# Patient Record
Sex: Female | Born: 1980 | Race: White | Hispanic: No | Smoking: Former smoker
Health system: Southern US, Community
[De-identification: ages and names within clinical notes are randomized; demographics above are authoritative.]

## PROBLEM LIST (undated history)

## (undated) ENCOUNTER — Inpatient Hospital Stay (HOSPITAL_COMMUNITY): Payer: Self-pay

## (undated) ENCOUNTER — Inpatient Hospital Stay (HOSPITAL_COMMUNITY): Payer: No Typology Code available for payment source

## (undated) DIAGNOSIS — G43909 Migraine, unspecified, not intractable, without status migrainosus: Secondary | ICD-10-CM

## (undated) DIAGNOSIS — K219 Gastro-esophageal reflux disease without esophagitis: Secondary | ICD-10-CM

## (undated) DIAGNOSIS — O009 Unspecified ectopic pregnancy without intrauterine pregnancy: Secondary | ICD-10-CM

## (undated) DIAGNOSIS — F32A Depression, unspecified: Secondary | ICD-10-CM

## (undated) DIAGNOSIS — R102 Pelvic and perineal pain: Secondary | ICD-10-CM

## (undated) DIAGNOSIS — F329 Major depressive disorder, single episode, unspecified: Secondary | ICD-10-CM

## (undated) DIAGNOSIS — I10 Essential (primary) hypertension: Secondary | ICD-10-CM

## (undated) DIAGNOSIS — G8929 Other chronic pain: Secondary | ICD-10-CM

## (undated) DIAGNOSIS — F419 Anxiety disorder, unspecified: Secondary | ICD-10-CM

## (undated) DIAGNOSIS — T7840XA Allergy, unspecified, initial encounter: Secondary | ICD-10-CM

## (undated) HISTORY — PX: WISDOM TOOTH EXTRACTION: SHX21

## (undated) HISTORY — DX: Migraine, unspecified, not intractable, without status migrainosus: G43.909

## (undated) HISTORY — DX: Allergy, unspecified, initial encounter: T78.40XA

## (undated) HISTORY — PX: MOLE REMOVAL: SHX2046

---

## 2003-12-08 ENCOUNTER — Other Ambulatory Visit: Admission: RE | Admit: 2003-12-08 | Discharge: 2003-12-08 | Payer: Self-pay | Admitting: Obstetrics and Gynecology

## 2004-01-13 ENCOUNTER — Encounter (INDEPENDENT_AMBULATORY_CARE_PROVIDER_SITE_OTHER): Payer: Self-pay | Admitting: *Deleted

## 2004-01-13 ENCOUNTER — Ambulatory Visit (HOSPITAL_COMMUNITY): Admission: RE | Admit: 2004-01-13 | Discharge: 2004-01-13 | Payer: Self-pay | Admitting: Obstetrics and Gynecology

## 2005-01-11 ENCOUNTER — Encounter: Admission: RE | Admit: 2005-01-11 | Discharge: 2005-01-11 | Payer: Self-pay | Admitting: Neurology

## 2005-03-07 ENCOUNTER — Other Ambulatory Visit: Admission: RE | Admit: 2005-03-07 | Discharge: 2005-03-07 | Payer: Self-pay | Admitting: Obstetrics and Gynecology

## 2006-03-12 ENCOUNTER — Other Ambulatory Visit: Admission: RE | Admit: 2006-03-12 | Discharge: 2006-03-12 | Payer: Self-pay | Admitting: Obstetrics and Gynecology

## 2007-03-22 ENCOUNTER — Other Ambulatory Visit: Admission: RE | Admit: 2007-03-22 | Discharge: 2007-03-22 | Payer: Self-pay | Admitting: Obstetrics and Gynecology

## 2007-09-12 ENCOUNTER — Other Ambulatory Visit: Admission: RE | Admit: 2007-09-12 | Discharge: 2007-09-12 | Payer: Self-pay | Admitting: Obstetrics and Gynecology

## 2008-03-19 ENCOUNTER — Other Ambulatory Visit: Admission: RE | Admit: 2008-03-19 | Discharge: 2008-03-19 | Payer: Self-pay | Admitting: Obstetrics and Gynecology

## 2009-04-06 ENCOUNTER — Other Ambulatory Visit: Admission: RE | Admit: 2009-04-06 | Discharge: 2009-04-06 | Payer: Self-pay | Admitting: Obstetrics and Gynecology

## 2010-07-15 NOTE — Op Note (Signed)
NAMEJONELL, BRUMBAUGH                ACCOUNT NO.:  [id number]   MEDICAL RECORD NO.:  [id number]          PATIENT TYPE:  AMB   LOCATION:  SDC                           FACILITY:  [facility]   PHYSICIAN:  Charles A. 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   PROCEDURE:  Wide local excision.   SURGEON:  Charles A. Delcambre, MD   ASSISTANT:  None.   COMPLICATIONS:  None.   ESTIMATED BLOOD LOSS:  Nil.   SPECIMENS:  Elliptical specimen with margins approximately 2-3 mm  bilaterally.   COUNTS:  Instrument and sponge count correct.  Needle count correct.   ANESTHESIA:  Laryngeal general and local block.   DESCRIPTION OF PROCEDURE:  The patient was taken to the operating room,  placed in the supine position and general anesthetic was induced without  difficulty.  Dorsal lithotomy position was then done and a sterile prep and  drape was undertaken.  A marking pen was then used to mark elliptical  specimen for excision with adequate margins and careful excision with a  knife was undertaken.  Deep closure with 3-0 Vicryl was used, interrupted  sutures and then a subcuticular suture of 3-0 Monocryl was used to close the  skin.  The patient was awakened and taken to recovery with physician in  attendance having tolerated the procedure well.      CAD/MEDQ  D:  01/13/2004  T:  01/14/2004  Job:  [id number]

## 2010-07-15 NOTE — H&P (Signed)
Karen Holder, Karen Holder                ACCOUNT NO.:  [id number]   MEDICAL RECORD NO.:  [id number]          PATIENT TYPE:  AMB   LOCATION:  SDC                           FACILITY:  [facility]   PHYSICIAN:  Charles A. Delcambre, MDDATE OF BIRTH:  September 29, 1980   DATE OF ADMISSION:  01/13/2004  DATE OF DISCHARGE:                                HISTORY & PHYSICAL   She is to be admitted January 13, 2004, for wide local excision of the  vulva for vulvar lesion.  This lesion has been undertaken for biopsy showing  atypical genital nevus with recommendation for wide complete local excision.   PAST MEDICAL HISTORY:  Migraine headaches.   PAST SURGICAL HISTORY:  None.   MEDICATIONS:  Relpax 40 mg p.r.n. migraine, Topamax 100 mg at bedtime, and  Baclofen 100 mg t.i.d., all for migraines.  Ortho Tri-Cyclen once a day for  birth control.   ALLERGIES:  FLU SHOT reaction?   SOCIAL HISTORY:  No tobacco, ethanol, or drug use.  STD exposure in the  past.  The patient is married and in a monogamous relationship with her  husband.  Stopped smoking three years ago.  Recently married in August.  The  patient is a Software engineer at a neurosurgeon's.   FAMILY HISTORY:  No siblings.  Mother and father with hypertension.  Father  69, mother 40.  Otherwise, no gynecological cancers.   REVIEW OF SYSTEMS:  No fever, chills, rashes, lesions, headaches, dizziness.  Some seasonal allergies.  No chest pain, shortness of breath, wheezing,  diarrhea, constipation, bleeding, melena, hematochezia, urgency, frequency,  dysuria, incontinence, hematuria, galactorrhea.  She does have some very  slight itching, but otherwise the lesion has been present for many years and  is asymptomatic at this time.   PHYSICAL EXAMINATION:  GENERAL:  She is alert and oriented x3, in no  distress.  Alert and oriented x3.  VITAL SIGNS:  Weight 139 pounds, pulse 76, blood pressure 110/80.  HEENT:  Grossly within normal limits.  NECK:   Supple without thyromegaly.  LUNGS:  Clear bilaterally.  BACK:  CVA nontender bilaterally.  Vertebral column nontender.  BREASTS:  No mass, tenderness, or discharge.  Skin:  No changes bilaterally.  CARDIAC:  Regular rate and rhythm without murmur, rub, or gallop.  Lungs are  clear.  ABDOMEN:  Soft, flat, nontender.  No hepatosplenomegaly or other masses  noted.  Groins nontender, no masses are palpable.  PELVIC:  Vulva:  On the right inferior labia area, there is a small lesion,  slightly raised with some pigmented spots, 1 cm in the inferior right labia  majora.  This is the area that was biopsied.  Otherwise, urethral meatus  normal.  Urethra normal.  Bladder normal.  Vagina normal.  Cervix  nulliparous.  Vault without discharge or lesions.  No cervical motion  tenderness.  Pap was normal.  Bimanual exam:  The uterus is retroflexed, six  weeks' size, mobile and nontender.  Adnexa nontender without masses  bilaterally.  Ovaries palpably normal size bilaterally.   ASSESSMENT:  A perineal lesion, atypical nevus, for  wide local excision.   PLAN:  The patient will be admitted.  A CBC will be drawn.  NPO past  midnight.  Wide local excision, likely with stitches, will be undertaken.  She gives informed consent, understands a significant scar will be  remaining, but we need to get at least 1-2 mm margins on the lesion and  would expect this incision to be at least 2 cm in length to get an  elliptical excision of the lesion.  She was in agreement and gives informed  consent, and we will proceed as outlined.      CAD/MEDQ  D:  01/06/2004  T:  01/06/2004  Job:  [id number]

## 2010-09-01 ENCOUNTER — Encounter: Payer: Self-pay | Admitting: Gastroenterology

## 2010-09-26 ENCOUNTER — Ambulatory Visit
Admission: RE | Admit: 2010-09-26 | Discharge: 2010-09-26 | Disposition: A | Discharge status: Home / Self Care | Payer: BC Managed Care – PPO | Source: Ambulatory Visit | Attending: Neurological Surgery | Admitting: Neurological Surgery

## 2010-09-26 ENCOUNTER — Other Ambulatory Visit: Payer: Self-pay | Admitting: Neurological Surgery

## 2010-09-26 DIAGNOSIS — R102 Pelvic and perineal pain: Secondary | ICD-10-CM

## 2010-09-26 MED ORDER — IOHEXOL 300 MG/ML  SOLN: 100.0000 mL | Freq: Once | INTRAMUSCULAR | Status: AC | PRN

## 2010-09-27 ENCOUNTER — Other Ambulatory Visit: Payer: Self-pay | Admitting: Neurological Surgery

## 2010-09-27 ENCOUNTER — Ambulatory Visit
Admission: RE | Admit: 2010-09-27 | Discharge: 2010-09-27 | Disposition: A | Discharge status: Home / Self Care | Payer: BC Managed Care – PPO | Source: Ambulatory Visit | Attending: Neurological Surgery | Admitting: Neurological Surgery

## 2010-09-27 DIAGNOSIS — R05 Cough: Secondary | ICD-10-CM

## 2010-10-07 ENCOUNTER — Ambulatory Visit: Payer: Self-pay | Admitting: Gastroenterology

## 2010-10-10 ENCOUNTER — Ambulatory Visit (INDEPENDENT_AMBULATORY_CARE_PROVIDER_SITE_OTHER): Payer: Self-pay | Admitting: General Surgery

## 2011-08-22 LAB — OB RESULTS CONSOLE ABO/RH: RH Type: POSITIVE

## 2011-08-22 LAB — OB RESULTS CONSOLE RUBELLA ANTIBODY, IGM: Rubella: IMMUNE

## 2011-08-22 LAB — OB RESULTS CONSOLE HIV ANTIBODY (ROUTINE TESTING): HIV: NONREACTIVE

## 2011-09-04 LAB — OB RESULTS CONSOLE GC/CHLAMYDIA: Gonorrhea: NEGATIVE

## 2012-02-28 ENCOUNTER — Encounter (HOSPITAL_COMMUNITY): Payer: Self-pay

## 2012-02-28 ENCOUNTER — Inpatient Hospital Stay (HOSPITAL_COMMUNITY)
Admission: AD | Admit: 2012-02-28 | Discharge: 2012-02-29 | Disposition: A | Discharge status: Home / Self Care | Payer: No Typology Code available for payment source | Source: Ambulatory Visit | Attending: Obstetrics & Gynecology | Admitting: Obstetrics & Gynecology

## 2012-02-28 DIAGNOSIS — O99891 Other specified diseases and conditions complicating pregnancy: Secondary | ICD-10-CM | POA: Insufficient documentation

## 2012-02-28 DIAGNOSIS — R030 Elevated blood-pressure reading, without diagnosis of hypertension: Secondary | ICD-10-CM | POA: Insufficient documentation

## 2012-02-28 DIAGNOSIS — M545 Low back pain, unspecified: Secondary | ICD-10-CM | POA: Insufficient documentation

## 2012-02-28 DIAGNOSIS — M899 Disorder of bone, unspecified: Secondary | ICD-10-CM | POA: Insufficient documentation

## 2012-02-28 DIAGNOSIS — I10 Essential (primary) hypertension: Secondary | ICD-10-CM

## 2012-02-28 DIAGNOSIS — M5430 Sciatica, unspecified side: Secondary | ICD-10-CM | POA: Insufficient documentation

## 2012-02-28 DIAGNOSIS — M259 Joint disorder, unspecified: Secondary | ICD-10-CM | POA: Insufficient documentation

## 2012-02-28 HISTORY — DX: Depression, unspecified: F32.A

## 2012-02-28 HISTORY — DX: Major depressive disorder, single episode, unspecified: F32.9

## 2012-02-28 HISTORY — DX: Essential (primary) hypertension: I10

## 2012-02-28 LAB — URINALYSIS, ROUTINE W REFLEX MICROSCOPIC
Bilirubin Urine: NEGATIVE
Glucose, UA: NEGATIVE mg/dL
Hgb urine dipstick: NEGATIVE
Nitrite: NEGATIVE
Protein, ur: NEGATIVE mg/dL
Specific Gravity, Urine: 1.01 (ref 1.005–1.030)
Urobilinogen, UA: 0.2 mg/dL (ref 0.0–1.0)
pH: 7 (ref 5.0–8.0)

## 2012-02-28 LAB — PROTEIN / CREATININE RATIO, URINE
Creatinine, Urine: 44.41 mg/dL
Protein Creatinine Ratio: 0.25 — ABNORMAL HIGH (ref 0.00–0.15)
Total Protein, Urine: 11 mg/dL

## 2012-02-28 LAB — COMPREHENSIVE METABOLIC PANEL
ALT: 18 U/L (ref 0–35)
Albumin: 2.6 g/dL — ABNORMAL LOW (ref 3.5–5.2)
Alkaline Phosphatase: 115 U/L (ref 39–117)
BUN: 6 mg/dL (ref 6–23)
CO2: 25 mEq/L (ref 19–32)
Chloride: 105 mEq/L (ref 96–112)
Creatinine, Ser: 0.78 mg/dL (ref 0.50–1.10)
GFR calc Af Amer: >90 mL/min (ref >90)
GFR calc non Af Amer: >90 mL/min (ref >90)
Glucose, Bld: 86 mg/dL (ref 70–99)
Potassium: 3.5 mEq/L (ref 3.5–5.1)
Sodium: 139 mEq/L (ref 135–145)
Total Bilirubin: 0.3 mg/dL (ref 0.3–1.2)
Total Protein: 6 g/dL (ref 6.0–8.3)

## 2012-02-28 LAB — CBC
HCT: 36.6 % (ref 36.0–46.0)
Hemoglobin: 12.5 g/dL (ref 12.0–15.0)
MCH: 29.9 pg (ref 26.0–34.0)
MCHC: 34.2 g/dL (ref 30.0–36.0)
MCV: 87.6 fL (ref 78.0–100.0)
RBC: 4.18 MIL/uL (ref 3.87–5.11)
RDW: 13.5 % (ref 11.5–15.5)
WBC: 10.6 10*3/uL — ABNORMAL HIGH (ref 4.0–10.5)

## 2012-02-28 LAB — URINE MICROSCOPIC-ADD ON

## 2012-02-28 LAB — URIC ACID: Uric Acid, Serum: 5 mg/dL (ref 2.4–7.0)

## 2012-02-28 LAB — LACTATE DEHYDROGENASE: LDH: 151 U/L (ref 94–250)

## 2012-02-28 MED ORDER — LACTATED RINGERS IV BOLUS (SEPSIS)
500.0000 mL | Freq: Once | INTRAVENOUS | Status: AC
  Administered 2012-02-28: 500 mL via INTRAVENOUS

## 2012-02-28 NOTE — MAU Note (Signed)
Contractions and back pain radiating down left leg today called on call nurse was told to come in to be checked, no LOF, no vaginal bleeding, positive fm, no problems during pregnancy.

## 2012-02-28 NOTE — MAU Note (Signed)
Patient taken directly to room 6 from lobby.

## 2012-02-28 NOTE — MAU Provider Note (Signed)
History   Patient had pesented with complaint of lower back c/w sciatia. On presentation the patient had elevated B/p and now will investigate for [facility] or onset of Pre-clampsia G1P0000, [redacted]w[redacted]d    CSN: [id number]  Arrival date and time: 02/28/12 1941   None     Chief Complaint  Patient presents with  . Contractions   HPI  OB History    Grav Para Term Preterm Abortions TAB SAB Ect Mult Living   1               Past Medical History  Diagnosis Date  . Depression     No past surgical history on file.  No family history on file.  History  Substance Use Topics  . Smoking status: Never Smoker   . Smokeless tobacco: Not on file  . Alcohol Use: No    Allergies:  Allergies  Allergen Reactions  . Eggs Or Egg-Derived Products Diarrhea    Prescriptions prior to admission  Medication Sig Dispense Refill  . acetaminophen (TYLENOL) 325 MG tablet Take 650 mg by mouth every 6 (six) hours as needed. headache      . butalbital-acetaminophen-caffeine (FIORICET, ESGIC) 50-325-40 MG per tablet Take 1 tablet by mouth 2 (two) times daily as needed. migrain      . calcium carbonate (TUMS - DOSED IN MG ELEMENTAL CALCIUM) 500 MG chewable tablet Chew 1 tablet by mouth daily. heartburn      . FLUoxetine (PROZAC) 40 MG capsule Take 40 mg by mouth daily. Depression      . Prenatal Vit-Fe Fumarate-FA (PRENATAL MULTIVITAMIN) TABS Take 1 tablet by mouth daily.      Marland Kitchen zolpidem (AMBIEN CR) 12.5 MG CR tablet Take 12.5 mg by mouth at bedtime as needed. sleep        Review of Systems  Constitutional: Negative.   HENT: Negative.   Eyes: Negative.   Respiratory: Negative.   Cardiovascular:       Elevated b/p   Gastrointestinal: Negative.   Genitourinary: Negative.   Musculoskeletal: Negative.   Skin: Negative.   Endo/Heme/Allergies: Negative.   Psychiatric/Behavioral: Negative.    Physical Exam   Blood pressure 152/98, pulse 78, temperature 98.5 F (36.9 C), temperature source Oral, resp.  rate 18, height 5\' 5"  (1.651 m), weight 102.15 kg (225 lb 3.2 oz).  Physical Exam  Constitutional: She is oriented to person, place, and time. She appears well-developed and well-nourished.  HENT:  Head: Normocephalic and atraumatic.  Right Ear: External ear normal.  Eyes: Conjunctivae normal are normal. Pupils are equal, round, and reactive to light.  Neck: Normal range of motion. Neck supple.  Cardiovascular: Normal rate, regular rhythm and normal heart sounds.   Respiratory: Effort normal and breath sounds normal.  GI: Soft. Bowel sounds are normal.  Genitourinary: Vagina normal and uterus normal.       SVE: Cx closed /90%/+1, Vx presentation with Vx engaged in pelvis.  Irregular uterine contractions at time of admission and IV hydation attempted but infiltrated. Po hydration and Uc's subsided spontaneously.   Musculoskeletal: Normal range of motion.  Neurological: She is alert and oriented to person, place, and time. She has normal reflexes.  Skin: Skin is warm and dry.  Psychiatric: She has a normal mood and affect.    MAU Course  Procedures CBC, CMP, P/C ratio  And Serial B/p's, to collect 24 hr urine Protein/Creatinine as outpatient.  Assessment and Plan  Collaboration of management with Dr Seymour Bars. Patient has had labile B/p's  and main complaint sciatica and patient has been advised to wear pregnancy belt and to use heating pad for symptom relief. Patient had been offered medications but declined. Patient will use OTC Tylenol for analgesia. Will run 24 hr baseline urine for protein/ creatinine as outpatient and to return to lab at office . Jug given to commence 24 hr urine collection. Plan to discharge to home and has scheduled Appointment with Dr. Juliene Pina 03/06/12.  Prospero Mahnke, CNM. 02/28/2012, 11:09 PM

## 2012-02-28 NOTE — L&D Delivery Note (Signed)
Delivery Note  First Stage: Labor onset: 0100 Augmentation : pitocin Analgesia /Anesthesia intrapartum: epidural ROM spontaneous at 0100  Second Stage: Complete dilation at 0725 Onset of pushing at 0730 FHR second stage 150s  Delivery of a viable female at 64 by CNM in LOA position.  Loose nuchal cord x 1 reduced over head at birth Cord double clamped after cessation of pulsation, cut by FOB.  Cord blood sample collected.   Third Stage: Placenta delivered shultz intact with 3 VC.  Placenta - 0801 Uterine tone boggy with brisk bleeding - pitocin bolus in IVF  Expressed clots  Straight cath - >3107ml out Fundus firm - LUSD remains boggy despite pitocin / massage - cytotec per rectum Bimanual compression of cervix and LUSD for hemostasis  Perineum intact Superficial vaginal laceration identified - hemostatic Sulcus intact / cervix intact just boggy and remained dilated initially - responded well to cytotec  Repair not indicated Est. Blood Loss (mL): - in collection bag (some clots included)  Complications: mild postpartum hemorrhage  Mom to postpartum.  Baby to nursery-stable but some grunting at rest  Newborn: Birth Weight: 6 lb 7 oz Apgar Scores:  7-8 Feeding planned: breast  Marlinda Mike CNM, MSN 03/03/2012, 8:26 AM

## 2012-02-28 NOTE — MAU Note (Signed)
Patient c/o IV hurting, IV discontinued. Explained to patient will not restart at this time.

## 2012-03-03 ENCOUNTER — Encounter (HOSPITAL_COMMUNITY): Payer: Self-pay | Admitting: Anesthesiology

## 2012-03-03 ENCOUNTER — Inpatient Hospital Stay (HOSPITAL_COMMUNITY)
Admission: AD | Admit: 2012-03-03 | Discharge: 2012-03-05 | DRG: 774 | Disposition: A | Discharge status: Home / Self Care | Payer: No Typology Code available for payment source | Source: Ambulatory Visit | Attending: Obstetrics and Gynecology | Admitting: Obstetrics and Gynecology

## 2012-03-03 ENCOUNTER — Encounter (HOSPITAL_COMMUNITY): Payer: Self-pay | Admitting: *Deleted

## 2012-03-03 ENCOUNTER — Inpatient Hospital Stay (HOSPITAL_COMMUNITY): Payer: No Typology Code available for payment source | Admitting: Anesthesiology

## 2012-03-03 DIAGNOSIS — D62 Acute posthemorrhagic anemia: Secondary | ICD-10-CM | POA: Diagnosis not present

## 2012-03-03 DIAGNOSIS — O139 Gestational [pregnancy-induced] hypertension without significant proteinuria, unspecified trimester: Principal | ICD-10-CM | POA: Diagnosis present

## 2012-03-03 DIAGNOSIS — O9903 Anemia complicating the puerperium: Secondary | ICD-10-CM | POA: Diagnosis not present

## 2012-03-03 DIAGNOSIS — E669 Obesity, unspecified: Secondary | ICD-10-CM | POA: Diagnosis present

## 2012-03-03 DIAGNOSIS — O3660X0 Maternal care for excessive fetal growth, unspecified trimester, not applicable or unspecified: Secondary | ICD-10-CM | POA: Diagnosis present

## 2012-03-03 LAB — CBC
HCT: 35.9 % — ABNORMAL LOW (ref 36.0–46.0)
HCT: 26.9 % — ABNORMAL LOW (ref 36.0–46.0)
Hemoglobin: 12.2 g/dL (ref 12.0–15.0)
Hemoglobin: 9.1 g/dL — ABNORMAL LOW (ref 12.0–15.0)
MCH: 29.8 pg (ref 26.0–34.0)
MCH: 30.1 pg (ref 26.0–34.0)
MCH: 30.1 pg (ref 26.0–34.0)
MCHC: 34 g/dL (ref 30.0–36.0)
MCHC: 33.8 g/dL (ref 30.0–36.0)
MCV: 87.8 fL (ref 78.0–100.0)
MCV: 89.2 fL (ref 78.0–100.0)
MCV: 89.1 fL (ref 78.0–100.0)
Platelets: 164 10*3/uL (ref 150–400)
Platelets: 158 10*3/uL (ref 150–400)
Platelets: 143 10*3/uL — ABNORMAL LOW (ref 150–400)
RBC: 4.09 MIL/uL (ref 3.87–5.11)
RBC: 3.02 MIL/uL — ABNORMAL LOW (ref 3.87–5.11)
RDW: 13.3 % (ref 11.5–15.5)
RDW: 13.6 % (ref 11.5–15.5)
RDW: 13.5 % (ref 11.5–15.5)
WBC: 10.9 10*3/uL — ABNORMAL HIGH (ref 4.0–10.5)
WBC: 22.1 10*3/uL — ABNORMAL HIGH (ref 4.0–10.5)
WBC: 14.5 10*3/uL — ABNORMAL HIGH (ref 4.0–10.5)

## 2012-03-03 LAB — URINE MICROSCOPIC-ADD ON

## 2012-03-03 LAB — POCT FERN TEST: POCT Fern Test: POSITIVE

## 2012-03-03 LAB — URINALYSIS, ROUTINE W REFLEX MICROSCOPIC
Bilirubin Urine: NEGATIVE
Glucose, UA: NEGATIVE mg/dL
Ketones, ur: NEGATIVE mg/dL
Leukocytes, UA: NEGATIVE
Nitrite: NEGATIVE
Protein, ur: NEGATIVE mg/dL
Urobilinogen, UA: 0.2 mg/dL (ref 0.0–1.0)

## 2012-03-03 LAB — COMPREHENSIVE METABOLIC PANEL
ALT: 14 U/L (ref 0–35)
AST: 15 U/L (ref 0–37)
Albumin: 2.4 g/dL — ABNORMAL LOW (ref 3.5–5.2)
Alkaline Phosphatase: 114 U/L (ref 39–117)
BUN: 9 mg/dL (ref 6–23)
CO2: 25 mEq/L (ref 19–32)
Calcium: 9.8 mg/dL (ref 8.4–10.5)
Chloride: 103 mEq/L (ref 96–112)
Creatinine, Ser: 0.72 mg/dL (ref 0.50–1.10)
GFR calc Af Amer: >90 mL/min (ref >90)
GFR calc non Af Amer: >90 mL/min (ref >90)
Glucose, Bld: 90 mg/dL (ref 70–99)
Potassium: 3.5 mEq/L (ref 3.5–5.1)
Sodium: 139 mEq/L (ref 135–145)
Total Bilirubin: 0.3 mg/dL (ref 0.3–1.2)
Total Protein: 5.7 g/dL — ABNORMAL LOW (ref 6.0–8.3)

## 2012-03-03 LAB — LACTATE DEHYDROGENASE: LDH: 149 U/L (ref 94–250)

## 2012-03-03 LAB — URIC ACID: Uric Acid, Serum: 5.4 mg/dL (ref 2.4–7.0)

## 2012-03-03 LAB — RPR: RPR Ser Ql: NONREACTIVE

## 2012-03-03 MED ORDER — LANOLIN HYDROUS EX OINT: TOPICAL_OINTMENT | CUTANEOUS | Status: DC | PRN

## 2012-03-03 MED ORDER — SIMETHICONE 80 MG PO CHEW: 80.0000 mg | CHEWABLE_TABLET | ORAL | Status: DC | PRN

## 2012-03-03 MED ORDER — FENTANYL 2.5 MCG/ML BUPIVACAINE 1/10 % EPIDURAL INFUSION (WH - ANES)
14.0000 mL/h | INTRAMUSCULAR | Status: DC
  Filled 2012-03-03: qty 125

## 2012-03-03 MED ORDER — MISOPROSTOL 200 MCG PO TABS
ORAL_TABLET | ORAL | Status: AC
  Administered 2012-03-03: 800 ug via RECTAL
  Filled 2012-03-03: qty 4

## 2012-03-03 MED ORDER — OXYTOCIN 40 UNITS IN LACTATED RINGERS INFUSION - SIMPLE MED: 62.5000 mL/h | INTRAVENOUS | Status: AC

## 2012-03-03 MED ORDER — DIBUCAINE 1 % RE OINT: 1.0000 "application " | TOPICAL_OINTMENT | RECTAL | Status: DC | PRN

## 2012-03-03 MED ORDER — LIDOCAINE HCL (PF) 1 % IJ SOLN
30.0000 mL | INTRAMUSCULAR | Status: DC | PRN
  Administered 2012-03-03: 30 mL via SUBCUTANEOUS
  Filled 2012-03-03: qty 30

## 2012-03-03 MED ORDER — LACTATED RINGERS IV SOLN: 500.0000 mL | INTRAVENOUS | Status: DC | PRN

## 2012-03-03 MED ORDER — NALBUPHINE SYRINGE 5 MG/0.5 ML
5.0000 mg | INJECTION | INTRAMUSCULAR | Status: DC | PRN
  Administered 2012-03-03: 5 mg via INTRAVENOUS
  Filled 2012-03-03: qty 0.5

## 2012-03-03 MED ORDER — BENZOCAINE-MENTHOL 20-0.5 % EX AERO
1.0000 "application " | INHALATION_SPRAY | CUTANEOUS | Status: DC | PRN
  Administered 2012-03-03: 1 via TOPICAL
  Filled 2012-03-03: qty 56

## 2012-03-03 MED ORDER — LACTATED RINGERS IV SOLN
INTRAVENOUS | Status: DC
  Administered 2012-03-03 (×2): via INTRAVENOUS

## 2012-03-03 MED ORDER — OXYTOCIN BOLUS FROM INFUSION: 500.0000 mL | INTRAVENOUS | Status: DC

## 2012-03-03 MED ORDER — CITRIC ACID-SODIUM CITRATE 334-500 MG/5ML PO SOLN: 30.0000 mL | ORAL | Status: DC | PRN

## 2012-03-03 MED ORDER — PHENYLEPHRINE 40 MCG/ML (10ML) SYRINGE FOR IV PUSH (FOR BLOOD PRESSURE SUPPORT): 80.0000 ug | PREFILLED_SYRINGE | INTRAVENOUS | Status: DC | PRN

## 2012-03-03 MED ORDER — EPHEDRINE 5 MG/ML INJ: 10.0000 mg | INTRAVENOUS | Status: DC | PRN

## 2012-03-03 MED ORDER — ONDANSETRON HCL 4 MG/2ML IJ SOLN: 4.0000 mg | INTRAMUSCULAR | Status: DC | PRN

## 2012-03-03 MED ORDER — IBUPROFEN 600 MG PO TABS
600.0000 mg | ORAL_TABLET | Freq: Four times a day (QID) | ORAL | Status: DC
  Administered 2012-03-03 – 2012-03-05 (×8): 600 mg via ORAL
  Filled 2012-03-03 (×8): qty 1

## 2012-03-03 MED ORDER — PRENATAL MULTIVITAMIN CH
1.0000 | ORAL_TABLET | Freq: Every day | ORAL | Status: DC
  Administered 2012-03-03 – 2012-03-05 (×3): 1 via ORAL
  Filled 2012-03-03 (×3): qty 1

## 2012-03-03 MED ORDER — FLUOXETINE HCL 20 MG PO CAPS
40.0000 mg | ORAL_CAPSULE | Freq: Every day | ORAL | Status: DC
  Administered 2012-03-03 – 2012-03-05 (×3): 40 mg via ORAL
  Filled 2012-03-03 (×3): qty 2

## 2012-03-03 MED ORDER — OXYTOCIN 40 UNITS IN LACTATED RINGERS INFUSION - SIMPLE MED: 62.5000 mL/h | INTRAVENOUS | Status: DC

## 2012-03-03 MED ORDER — FLUOXETINE HCL 40 MG PO CAPS
40.0000 mg | ORAL_CAPSULE | Freq: Every day | ORAL | Status: DC
Start: 2012-03-03 — End: 2012-03-03

## 2012-03-03 MED ORDER — ONDANSETRON HCL 4 MG/2ML IJ SOLN: 4.0000 mg | Freq: Four times a day (QID) | INTRAMUSCULAR | Status: DC | PRN

## 2012-03-03 MED ORDER — ACETAMINOPHEN 325 MG PO TABS: 650.0000 mg | ORAL_TABLET | ORAL | Status: DC | PRN

## 2012-03-03 MED ORDER — FENTANYL 2.5 MCG/ML BUPIVACAINE 1/10 % EPIDURAL INFUSION (WH - ANES)
INTRAMUSCULAR | Status: DC | PRN
  Administered 2012-03-03: 14 mL/h via EPIDURAL

## 2012-03-03 MED ORDER — PHENYLEPHRINE 40 MCG/ML (10ML) SYRINGE FOR IV PUSH (FOR BLOOD PRESSURE SUPPORT)
80.0000 ug | PREFILLED_SYRINGE | INTRAVENOUS | Status: DC | PRN
  Filled 2012-03-03: qty 5

## 2012-03-03 MED ORDER — OXYCODONE-ACETAMINOPHEN 5-325 MG PO TABS: 1.0000 | ORAL_TABLET | ORAL | Status: DC | PRN

## 2012-03-03 MED ORDER — WITCH HAZEL-GLYCERIN EX PADS: 1.0000 "application " | MEDICATED_PAD | CUTANEOUS | Status: DC | PRN

## 2012-03-03 MED ORDER — EPHEDRINE 5 MG/ML INJ
10.0000 mg | INTRAVENOUS | Status: DC | PRN
  Filled 2012-03-03: qty 4

## 2012-03-03 MED ORDER — DIPHENHYDRAMINE HCL 50 MG/ML IJ SOLN: 12.5000 mg | INTRAMUSCULAR | Status: DC | PRN

## 2012-03-03 MED ORDER — LIDOCAINE HCL (PF) 1 % IJ SOLN
INTRAMUSCULAR | Status: DC | PRN
  Administered 2012-03-03: 9 mL
  Administered 2012-03-03: 8 mL

## 2012-03-03 MED ORDER — SENNOSIDES-DOCUSATE SODIUM 8.6-50 MG PO TABS
2.0000 | ORAL_TABLET | Freq: Every day | ORAL | Status: DC
  Administered 2012-03-03 – 2012-03-04 (×2): 2 via ORAL

## 2012-03-03 MED ORDER — ACETAMINOPHEN 325 MG PO TABS: 650.0000 mg | ORAL_TABLET | Freq: Four times a day (QID) | ORAL | Status: DC | PRN

## 2012-03-03 MED ORDER — ONDANSETRON HCL 4 MG PO TABS: 4.0000 mg | ORAL_TABLET | ORAL | Status: DC | PRN

## 2012-03-03 MED ORDER — DIPHENHYDRAMINE HCL 25 MG PO CAPS: 25.0000 mg | ORAL_CAPSULE | Freq: Four times a day (QID) | ORAL | Status: DC | PRN

## 2012-03-03 MED ORDER — LACTATED RINGERS IV SOLN: 500.0000 mL | Freq: Once | INTRAVENOUS | Status: DC

## 2012-03-03 MED ORDER — IBUPROFEN 600 MG PO TABS: 600.0000 mg | ORAL_TABLET | Freq: Four times a day (QID) | ORAL | Status: DC | PRN

## 2012-03-03 MED ORDER — NIFEDIPINE ER 30 MG PO TB24
30.0000 mg | ORAL_TABLET | Freq: Every day | ORAL | Status: DC
  Administered 2012-03-03 – 2012-03-05 (×3): 30 mg via ORAL
  Filled 2012-03-03 (×3): qty 1

## 2012-03-03 MED ORDER — MISOPROSTOL 200 MCG PO TABS: 800.0000 ug | ORAL_TABLET | Freq: Once | ORAL | Status: AC | PRN

## 2012-03-03 MED ORDER — OXYTOCIN 40 UNITS IN LACTATED RINGERS INFUSION - SIMPLE MED
1.0000 m[IU]/min | INTRAVENOUS | Status: DC
  Administered 2012-03-03: 2 m[IU]/min via INTRAVENOUS
  Administered 2012-03-03: 4 m[IU]/min via INTRAVENOUS
  Filled 2012-03-03: qty 1000

## 2012-03-03 NOTE — MAU Note (Signed)
Got up to go to BR about 0100 and leaked a lot of fld. THen went to BR and voided so don't think it was my urine. Vomited x 1. Have leaked few more drops of fld since then. Having some contractions

## 2012-03-03 NOTE — H&P (Signed)
  OB ADMISSION/ HISTORY & PHYSICAL:  Admission Date: 03/03/2012  1:15 AM  Admit Diagnosis: SROM at 36 weeks / latent labor onset / gestational hypertension  Karen Holder is a 32 y.o. female presenting for SROM at 0100 and onset of latent labor  Prenatal History: G1P0   EDC : 03/28/2012, by Other Basis  Prenatal care at [facility]  Primary Ob Provider: Mody Prenatal course complicated by obesity / size greater than dates / depression / gestational hypertension  Prenatal Labs: ABO, Rh:   O positive Antibody:  negative Rubella:   immune RPR:   NR HBsAg:   negative HIV:   NR GBS:   negative 1 hr Glucola : NL  Labs reviewed:   baseline platelet 315 / (11/4) 189 / (1/1) 160 / (1/3) 153  (1/1) sgot 16 / sgpt 18 / creatnine 0.7 / uric acid 5.0   Negative proteinuria on dip    24 hour urine results from 1/3 not available at this time  Medical / Surgical History :  Past medical history:  Past Medical History  Diagnosis Date  . Depression   . Cancer     Past surgical history:  Past Surgical History  Procedure Date  . Mole removal    Family History:  Family History  Problem Relation Age of Onset  . Other Neg Hx     Social History:  reports that she has quit smoking. She does not have any smokeless tobacco history on file. She reports that she does not drink alcohol or use illicit drugs.  Allergies: Eggs or egg-derived products   Current Medications at time of admission:  Prenatal daily prozac 40 mg daily ambien CR 12.5 HS prn sleep Fioricet 1-2 tabs prn migraine  Review of Systems: SROM 0100 clear fluid regular ctx since 0100 no headache, vision changes or epigastric pain  Physical Exam:  VS: Blood pressure 148/110, pulse 100, temperature 98.2 F (36.8 C), temperature source Oral, resp. rate 20, height 5\' 5"  (1.651 m), weight 102.422 kg (225 lb 12.8 oz).  General: alert and oriented, appears comfortable / NAD or pain Heart: RRR Lungs: Clear  lung fields Abdomen: Gravid, soft and non-tender, non-distended / uterus: gravid (palpated EFW 8 pounds) Extremities: 2+ dependent edema  Genitalia / VE: Dilation: 2.5 Effacement (%): 90 Station: 0 Exam by:: Wiliam Ke CNM  FHR: baseline rate 140 / variability moderate / accels + / decels none TOCO: mild ctx every 5-7 minutes  Assessment: 36 weeks SROM / LGA Negative GBS Latent labor Gestational hypertension - assess for PEC development   Plan:  Admit PIH labs Active management  Dr Billy Coast notified of admission / plan of care  Marlinda Mike CNM, MSN 03/03/2012, 2:01 AM

## 2012-03-03 NOTE — Anesthesia Procedure Notes (Signed)
Epidural Patient location during procedure: OB Start time: 03/03/2012 6:07 AM End time: 03/03/2012 6:12 AM  Staffing Anesthesiologist: Sandrea Hughs Performed by: anesthesiologist   Preanesthetic Checklist Completed: patient identified, site marked, surgical consent, pre-op evaluation, timeout performed, IV checked, risks and benefits discussed and monitors and equipment checked  Epidural Patient position: sitting Prep: site prepped and draped and DuraPrep Patient monitoring: continuous pulse ox and blood pressure Approach: midline Injection technique: LOR air  Needle:  Needle type: Tuohy  Needle gauge: 17 G Needle length: 9 cm and 9 Needle insertion depth: 7 cm Catheter type: closed end flexible Catheter size: 19 Gauge Catheter at skin depth: 12 cm Test dose: negative and Other  Assessment Sensory level: T9 Events: blood not aspirated, injection not painful, no injection resistance, negative IV test and no paresthesia  Additional Notes Reason for block:procedure for pain

## 2012-03-03 NOTE — Progress Notes (Signed)
S:  Requesting another dose of IV med  O:  VS: Blood pressure 153/99, pulse 102, temperature 98.7 F (37.1 C), temperature source Oral, resp. rate 18, height 5\' 5"  (1.651 m), weight 102.422 kg (225 lb 12.8 oz).        FHR : baseline 140 / variability moderate / accels + / decels none        Toco: contractions every 3-4 minutes / mild-moderate         Cervix : 4 / 80/ vtx 0        Membranes: clear fluid        IUPC placed  A: latent labor     Gestational hypertension     FHR category 1  P: augment with pitocin - gestational hypertension     Maintain lateral positioning     Repeat IV for now - recommend epidural   Marlinda Mike CNM, MSN 03/03/2012, 5:38 AM

## 2012-03-03 NOTE — Anesthesia Postprocedure Evaluation (Signed)
Anesthesia Post Note  Patient: Karen Holder  Procedure(s) Performed: * No procedures listed *  Anesthesia type: Epidural  Patient location: Mother/Baby  Post pain: Pain level controlled  Post assessment: Post-op Vital signs reviewed  Last Vitals:  Filed Vitals:   03/03/12 1500  BP: 129/86  Pulse: 91  Temp: 37.4 C  Resp: 18    Post vital signs: Reviewed  Level of consciousness: awake  Complications: No apparent anesthesia complications

## 2012-03-03 NOTE — Progress Notes (Signed)
S:  Resting since IV analgesia  O:  VS: Blood pressure 153/99, pulse 102, temperature 98.7 F (37.1 C), temperature source Oral, resp. rate 18, height 5\' 5"  (1.651 m), weight 102.422 kg (225 lb 12.8 oz).        FHR : baseline 135 / variability moderate / accels + / decels none        Toco: contractions every 3-5 minutes / mild         Labs reviewed: 0.78 / uric acid 5.4 / sgot 14 / sgpt 16 / plt 164  A: Latent labor     FHR category 1     Gestational hypertension - no evidence of preeclampsia  P: recheck cervix when awakes from therapeutic rest dose      If no progression - place IUPC and start pitocin augment   Marlinda Mike CNM, MSN 03/03/2012, 5:20 AM

## 2012-03-03 NOTE — MAU Provider Note (Signed)
  History     CSN: [id number]  Arrival date and time: 03/03/12 0115 Provider on unit to see patient @ 0130   Chief Complaint  Patient presents with  . Rupture of Membranes   HPI SROM - large gush of clear fluid at 0100 / GBS negative Onset of regular ctx since water broke + bloody show  No headache or blurred vision  Past Medical History  Diagnosis Date  . Depression   . Cancer    Past Surgical History  Procedure Date  . Mole removal    Family History  Problem Relation Age of Onset  . Other Neg Hx    History  Substance Use Topics  . Smoking status: Former Games developer  . Smokeless tobacco: Not on file  . Alcohol Use: No   Allergies:  Allergies  Allergen Reactions  . Eggs Or Egg-Derived Products Diarrhea    Prescriptions prior to admission  Medication Sig Dispense Refill  . acetaminophen (TYLENOL) 325 MG tablet Take 650 mg by mouth every 6 (six) hours as needed. headache      . butalbital-acetaminophen-caffeine (FIORICET, ESGIC) 50-325-40 MG per tablet Take 1 tablet by mouth 2 (two) times daily as needed. migrain      . calcium carbonate (TUMS - DOSED IN MG ELEMENTAL CALCIUM) 500 MG chewable tablet Chew 1 tablet by mouth daily. heartburn      . FLUoxetine (PROZAC) 40 MG capsule Take 40 mg by mouth daily. Depression      . Prenatal Vit-Fe Fumarate-FA (PRENATAL MULTIVITAMIN) TABS Take 1 tablet by mouth daily.      Marland Kitchen zolpidem (AMBIEN CR) 12.5 MG CR tablet Take 12.5 mg by mouth at bedtime as needed. sleep        ROS Physical Exam   Blood pressure 148/110, pulse 100, temperature 98.2 F (36.8 C), temperature source Oral, resp. rate 20, height 5\' 5"  (1.651 m), weight 102.422 kg (225 lb 12.8 oz).  Physical Exam Alert and oriented Abdomen soft and non-tender / gravid  Clear fluid leakage from introitus Sterile spec exam with +pooling / nitrazine test + VE 2-3 cm / 90% / vtx / 0 station / ROP / small show Extremities 2+ edema / DTR 2+  MAU Course   Procedures  Assessment and Plan  36 weeks with SROM Latent labor Negative GBS Elevated BP - consistent with gestational hypertension / possible PEC  1) admit 2) PIH labs 3) active management  Dr Billy Coast notified of admit / plan  Marlinda Mike 03/03/2012, 1:49 AM

## 2012-03-03 NOTE — Anesthesia Preprocedure Evaluation (Signed)
Anesthesia Evaluation  Patient identified by MRN, date of birth, ID band Patient awake    Reviewed: Allergy & Precautions, H&P , NPO status , Patient's Chart, lab work & pertinent test results  Airway Mallampati: II TM Distance: >3 FB Neck ROM: full    Dental No notable dental hx.    Pulmonary neg pulmonary ROS,    Pulmonary exam normal       Cardiovascular     Neuro/Psych PSYCHIATRIC DISORDERS Depression negative neurological ROS     GI/Hepatic negative GI ROS, Neg liver ROS,   Endo/Other  negative endocrine ROS  Renal/GU negative Renal ROS  negative genitourinary   Musculoskeletal negative musculoskeletal ROS (+)   Abdominal (+) + obese,   Peds negative pediatric ROS (+)  Hematology negative hematology ROS (+)   Anesthesia Other Findings   Reproductive/Obstetrics (+) Pregnancy                           Anesthesia Physical Anesthesia Plan  ASA: II  Anesthesia Plan: Epidural   Post-op Pain Management:    Induction:   Airway Management Planned:   Additional Equipment:   Intra-op Plan:   Post-operative Plan:   Informed Consent: I have reviewed the patients History and Physical, chart, labs and discussed the procedure including the risks, benefits and alternatives for the proposed anesthesia with the patient or authorized representative who has indicated his/her understanding and acceptance.     Plan Discussed with:   Anesthesia Plan Comments:         Anesthesia Quick Evaluation

## 2012-03-03 NOTE — Progress Notes (Signed)
Report called to Dana RN in BS.  

## 2012-03-04 LAB — CBC
HCT: 26.4 % — ABNORMAL LOW (ref 36.0–46.0)
Hemoglobin: 8.7 g/dL — ABNORMAL LOW (ref 12.0–15.0)
MCH: 29.6 pg (ref 26.0–34.0)
MCHC: 33 g/dL (ref 30.0–36.0)
MCV: 89.8 fL (ref 78.0–100.0)
Platelets: 141 10*3/uL — ABNORMAL LOW (ref 150–400)
RBC: 2.94 MIL/uL — ABNORMAL LOW (ref 3.87–5.11)
RDW: 13.6 % (ref 11.5–15.5)
WBC: 12.8 10*3/uL — ABNORMAL HIGH (ref 4.0–10.5)

## 2012-03-04 LAB — COMPREHENSIVE METABOLIC PANEL
ALT: 16 U/L (ref 0–35)
AST: 26 U/L (ref 0–37)
Albumin: 2.1 g/dL — ABNORMAL LOW (ref 3.5–5.2)
Alkaline Phosphatase: 91 U/L (ref 39–117)
BUN: 6 mg/dL (ref 6–23)
CO2: 25 mEq/L (ref 19–32)
Calcium: 8.6 mg/dL (ref 8.4–10.5)
Chloride: 105 mEq/L (ref 96–112)
Creatinine, Ser: 0.74 mg/dL (ref 0.50–1.10)
GFR calc Af Amer: >90 mL/min (ref >90)
GFR calc non Af Amer: >90 mL/min (ref >90)
Glucose, Bld: 69 mg/dL — ABNORMAL LOW (ref 70–99)
Potassium: 3.8 mEq/L (ref 3.5–5.1)
Sodium: 139 mEq/L (ref 135–145)
Total Bilirubin: 0.3 mg/dL (ref 0.3–1.2)
Total Protein: 4.9 g/dL — ABNORMAL LOW (ref 6.0–8.3)

## 2012-03-04 LAB — LACTATE DEHYDROGENASE: LDH: 233 U/L (ref 94–250)

## 2012-03-04 LAB — URIC ACID: Uric Acid, Serum: 5.4 mg/dL (ref 2.4–7.0)

## 2012-03-04 NOTE — Progress Notes (Signed)

## 2012-03-04 NOTE — Progress Notes (Signed)
Patient B/P elevated 148/100. Patient became upset and started to cry after infant began grunting and this staff person wanted to spot check infant O2 sat with nursery O2 monitor. Patient denies any headache, right upper quadrant pain, or nausea or vomiting. Recheck done at 2330 B/P 133/86.

## 2012-03-04 NOTE — Progress Notes (Signed)
Patient ID: Karen Holder, female   DOB: 12/02/1980, 32 y.o.   MRN: [id number] PPD # 1  Subjective: Pt reports feeling well/ Pain controlled with ibuprofen Denies HA, SOB, dizziness, CP, epigastric pain Tolerating po/ Voiding without problems/ No n/v Bleeding is light Newborn info:  Information for the patient's newborn:  Jamella, Grayer [[id number]]  female  / circ today, per Dr Mody/ Feeding: breast   Objective:  VS: Blood pressure 126/85, pulse 80, temperature 98.3 F (36.8 C), temperature source Oral, resp. rate 20, height 5\' 5"  (1.651 m)  WT: 102.422 kg (225 lb 12.8 oz), on admission 03/03/12 @ 122am   Basename 03/04/12 0515 03/03/12 1900  WBC 12.8* 14.5*  HGB 8.7* 9.1*  HCT 26.4* 26.9*  PLT 141* 143*    Blood type: --/--/O POS, O POS (01/05 0215) Rubella: Immune (06/25 0000)    Physical Exam:  General: A & O x 3  alert, cooperative and no distress CV: Regular rate and rhythm Resp: clear Abdomen: soft, nontender, normal bowel sounds Uterine Fundus: firm, below umbilicus, nontender Perineum: healing with good reapproximation Lochia: minimal Ext: edema +1 - +2 and Homans sign is negative, no sign of DVT   A/P: PPD # 1/ G1P0101/ S/P:spontaneous vaginal delivery  Hx Gestational HTN; controlled ABL Anemia; fe supplement at d/c. Will obtain weight Doing well Continue routine post partum orders Anticipate D/C home in AM    Demetrius Revel, MSN, [facility] 03/04/2012, 10:37 AM

## 2012-03-05 DIAGNOSIS — D62 Acute posthemorrhagic anemia: Secondary | ICD-10-CM | POA: Diagnosis not present

## 2012-03-05 LAB — TYPE AND SCREEN
Antibody Screen: NEGATIVE
Unit division: 0

## 2012-03-05 MED ORDER — POLYSACCHARIDE IRON COMPLEX 150 MG PO CAPS
150.0000 mg | ORAL_CAPSULE | Freq: Every day | ORAL | Status: DC
  Administered 2012-03-05: 150 mg via ORAL
  Filled 2012-03-05: qty 1

## 2012-03-05 MED ORDER — HYDROCHLOROTHIAZIDE 12.5 MG PO CAPS
12.5000 mg | ORAL_CAPSULE | Freq: Every day | ORAL | Status: DC
  Administered 2012-03-05: 12.5 mg via ORAL
  Filled 2012-03-05: qty 1

## 2012-03-05 MED ORDER — NIFEDIPINE ER 30 MG PO TB24: 30.0000 mg | ORAL_TABLET | Freq: Every day | ORAL | Status: DC

## 2012-03-05 MED ORDER — IBUPROFEN 600 MG PO TABS: 600.0000 mg | ORAL_TABLET | Freq: Four times a day (QID) | ORAL | Status: DC

## 2012-03-05 MED ORDER — POLYSACCHARIDE IRON COMPLEX 150 MG PO CAPS: 150.0000 mg | ORAL_CAPSULE | Freq: Every day | ORAL | Status: AC

## 2012-03-05 MED ORDER — HYDROCHLOROTHIAZIDE 12.5 MG PO CAPS: 12.5000 mg | ORAL_CAPSULE | Freq: Every day | ORAL | Status: DC

## 2012-03-05 NOTE — Progress Notes (Signed)
PPD 2 SVD  S:  Reports feeling well- ready to go home             Tolerating po/ No nausea or vomiting             Bleeding is light             Pain controlled with motrin             Up ad lib / ambulatory / voiding large volume of urine per patient             No headache - vision changes - or epigastric pain  Newborn breast feeding - using S&S     O:               VS: BP 133/80  Pulse 85  Temp 98.5 F (36.9 C) (Oral)  Resp 18  Ht 5\' 5"  (1.651 m)  Wt 95.8 kg (211 lb 3.2 oz)  BMI 35.15 kg/m2  Breastfeeding? Unknown   LABS:  Basename 03/04/12 0515 03/03/12 1900  WBC 12.8* 14.5*  HGB 8.7* 9.1*  PLT 141* 143*                                    I&O:   NOT done since transfer to postpartum                            Physical Exam:             Alert and oriented X3  Lungs: Clear and unlabored  Heart: regular rate and rhythm / no mumurs  Abdomen: soft, non-tender, non-distended              Fundus: firm, non-tender, U-2  Perineum: intact - no edema  Lochia: light  Extremities: 2+ edema - decreased since admission , no calf pain or tenderness    A: PPD # 2             Gestational hypertension - stable on Procardia             ABL anemia   Doing well - stable status  P:  Routine post partum orders  Discharge to home             WOB booklet - instructions reviewed  Marlinda Mike CNM, MSN 03/05/2012, 9:35 AM

## 2012-03-05 NOTE — Discharge Summary (Signed)
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  Discharge Diagnoses: Late pre- Term Pregnancy-delivered / gestational hypertension / dependent edema / ABL anemia from third stage atony in LUS  Discharge Information: Date: 03/05/2012 Activity: pelvic rest Diet: low salt diet / increased water consumption Medications: PNV, Ibuprofen, Colace, Iron and procardia 30 XL and HCTZ 12.5 Condition: stable Instructions: refer to practice specific booklet Discharge to: home Follow-up Information    Follow up with Marlinda Mike, CNM. Schedule an appointment as soon as possible for a visit in 1 week. (blood pressure check)    Contact information:   [address] [place] [address] [phone]          Newborn Data: Live born female  Birth Weight: 6 lb 7 oz (2920 g) APGAR: 7, 8  Home with mother.  Marlinda Mike 03/05/2012, 10:07 AM

## 2012-04-13 ENCOUNTER — Other Ambulatory Visit: Payer: Self-pay

## 2013-01-02 ENCOUNTER — Other Ambulatory Visit: Payer: Self-pay

## 2013-01-28 ENCOUNTER — Ambulatory Visit (INDEPENDENT_AMBULATORY_CARE_PROVIDER_SITE_OTHER): Payer: PRIVATE HEALTH INSURANCE | Admitting: Family Medicine

## 2013-01-28 VITALS — BP 132/92 | HR 100 | Temp 99.4°F | Resp 18 | Ht 65.0 in | Wt 195.0 lb

## 2013-01-28 DIAGNOSIS — R079 Chest pain, unspecified: Secondary | ICD-10-CM

## 2013-01-28 DIAGNOSIS — Z91030 Bee allergy status: Secondary | ICD-10-CM

## 2013-01-28 DIAGNOSIS — G43909 Migraine, unspecified, not intractable, without status migrainosus: Secondary | ICD-10-CM

## 2013-01-28 DIAGNOSIS — J309 Allergic rhinitis, unspecified: Secondary | ICD-10-CM

## 2013-01-28 DIAGNOSIS — IMO0001 Reserved for inherently not codable concepts without codable children: Secondary | ICD-10-CM

## 2013-01-28 DIAGNOSIS — I10 Essential (primary) hypertension: Secondary | ICD-10-CM

## 2013-01-28 DIAGNOSIS — Z91038 Other insect allergy status: Secondary | ICD-10-CM

## 2013-01-28 LAB — POCT URINALYSIS DIPSTICK
Bilirubin, UA: NEGATIVE
Blood, UA: NEGATIVE
Glucose, UA: NEGATIVE
Spec Grav, UA: 1.015
pH, UA: 8.5

## 2013-01-28 LAB — POCT CBC
HCT, POC: 45 % (ref 37.7–47.9)
Lymph, poc: 2.9 (ref 0.6–3.4)
MCHC: 31.6 g/dL — AB (ref 31.8–35.4)
MCV: 94.7 fL (ref 80–97)
MID (cbc): 0.4 (ref 0–0.9)
POC Granulocyte: 2.9 (ref 2–6.9)
POC LYMPH PERCENT: 47 %L (ref 10–50)
Platelet Count, POC: 284 10*3/uL (ref 142–424)
RDW, POC: 12.6 %

## 2013-01-28 LAB — COMPREHENSIVE METABOLIC PANEL
Albumin: 4.5 g/dL (ref 3.5–5.2)
BUN: 12 mg/dL (ref 6–23)
CO2: 28 mEq/L (ref 19–32)
Calcium: 10 mg/dL (ref 8.4–10.5)
Chloride: 104 mEq/L (ref 96–112)
Creat: 0.99 mg/dL (ref 0.50–1.10)
Glucose, Bld: 83 mg/dL (ref 70–99)
Potassium: 4.4 mEq/L (ref 3.5–5.3)

## 2013-01-28 LAB — TSH: TSH: 0.923 u[IU]/mL (ref 0.350–4.500)

## 2013-01-28 MED ORDER — EPINEPHRINE 0.3 MG/0.3ML IJ SOAJ: 0.3000 mg | Freq: Once | INTRAMUSCULAR | Status: AC

## 2013-01-28 MED ORDER — FLUTICASONE PROPIONATE 50 MCG/ACT NA SUSP: 2.0000 | Freq: Every day | NASAL | Status: DC

## 2013-01-28 MED ORDER — METOPROLOL SUCCINATE ER 25 MG PO TB24: 25.0000 mg | ORAL_TABLET | Freq: Every day | ORAL | Status: DC

## 2013-01-28 MED ORDER — SUMATRIPTAN 20 MG/ACT NA SOLN: 20.0000 mg | NASAL | Status: DC | PRN

## 2013-01-28 NOTE — Progress Notes (Signed)
Subjective:  This chart was scribed for Ethelda Chick, MD by Leone Payor, ED Scribe. This patient was seen in room 4 and the patient's care was started 10:38 AM.    Patient ID: Karen Holder, female    DOB: Jul 08, 1980, 32 y.o.   MRN: [id number]  HPI   HPI Comments: Karen Holder is a 32 y.o. female who presents to [facility] complaining of several complaints.   Pt has a history of migraines and has been having them more frequently for the past 6 months. Pt states her triggers are sleep deprivation and lack of proper diet, both of which have been a problem since the birth of her child (11 months ago). Pt states she began having migraines at the age of 30. Pt states she has been having a migraine at least once per week. She states they are usually right sided but occasionally can be on the left side. Her last episode was 2 days ago. She applies an ice pack with mild relief. She also has an aura, photophobia, nausea, and occasional vomiting with these episodes. She takes Imitrex nasal spray and tylenol with moderate relief. She denies seeing spots. She would like a refill for Imitrex because she was taken off during her pregnancy.   Pt is also concerned for elevated BP. Pt states she had had BP problems in the last 2 weeks of her pregnancy. She has been checking it and states it is running around 143/97.   Pt reports having frequent earaches with fluid buildup. Pt states she has been feeling congested and has been taking a decongestant. Pt has allergies and occasionally takes benadryl with relief.   Pt is allergic to bees and would like a refill for her epi pen.   Pt does not know her birth mother. Her father is 21 with HTN and high cholesterol. She states he used to take medications for this but he has been able control it with diet and exercise recently. She does not have any siblings. Pt denies smoking and alcohol use. She has been married for about 10 months with first child that  is 37 months old. She is not exercising currently.   Past Medical History  Diagnosis Date  . Depression   . Cancer   . Allergy   . Migraines     onset age 60; previous [facility] consultation.  Previous trial fo Topamax, Amitriptyline.    Allergies  Allergen Reactions  . Other Swelling    Bee stings   . Eggs Or Egg-Derived Products Diarrhea   . Prior to Admission medications   Medication Sig Start Date End Date Taking? Authorizing Provider  acetaminophen (TYLENOL) 325 MG tablet Take 650 mg by mouth every 6 (six) hours as needed. headache   Yes Historical Provider, MD  ALPRAZolam Prudy Feeler) 1 MG tablet Take 1 mg by mouth 4 (four) times daily as needed for anxiety.   Yes Historical Provider, MD  buPROPion (WELLBUTRIN XL) 150 MG 24 hr tablet Take 150 mg by mouth daily.   Yes Historical Provider, MD  butalbital-acetaminophen-caffeine (FIORICET, ESGIC) 50-325-40 MG per tablet Take 1 tablet by mouth 2 (two) times daily as needed. migrain   Yes Historical Provider, MD  calcium carbonate (TUMS - DOSED IN MG ELEMENTAL CALCIUM) 500 MG chewable tablet Chew 1 tablet by mouth daily. heartburn   Yes Historical Provider, MD  diphenhydrAMINE (BENADRYL) 25 mg capsule Take 25 mg by mouth every 6 (six) hours as needed.   Yes  Historical Provider, MD  FLUoxetine (PROZAC) 40 MG capsule Take 40 mg by mouth daily. Depression   Yes Historical Provider, MD  ibuprofen (ADVIL,MOTRIN) 600 MG tablet Take 1 tablet (600 mg total) by mouth every 6 (six) hours. 03/05/12  Yes Marlinda Mike, CNM  iron polysaccharides (NIFEREX) 150 MG capsule Take 1 capsule (150 mg total) by mouth daily. 03/05/12  Yes Marlinda Mike, CNM  magnesium gluconate (MAGONATE) 500 MG tablet Take 1,500 mg by mouth once.   Yes Historical Provider, MD  Omega-3 Fatty Acids (FISH OIL) 1000 MG CAPS Take by mouth.   Yes Historical Provider, MD  Prenatal Vit-Fe Fumarate-FA (PRENATAL MULTIVITAMIN) TABS Take 1 tablet by mouth daily.   Yes Historical Provider, MD    hydrochlorothiazide (MICROZIDE) 12.5 MG capsule Take 1 capsule (12.5 mg total) by mouth daily. 03/05/12   Marlinda Mike, CNM  zolpidem (AMBIEN CR) 12.5 MG CR tablet Take 12.5 mg by mouth at bedtime as needed. sleep    Historical Provider, MD   History   Social History  . Marital Status: Married    Spouse Name: N/A    Number of Children: 1  . Years of Education: N/A   Occupational History  .      Diplomatic Services operational officer at [employer]   Social History Main Topics  . Smoking status: Former Games developer  . Smokeless tobacco: Not on file  . Alcohol Use: No  . Drug Use: No  . Sexual Activity: Yes   Other Topics Concern  . 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      Tobacco: none      Alcohol: none      Drugs:  none   Family History  Problem Relation Age of Onset  . Other Neg Hx   . Hypertension Father   . Hyperlipidemia Father    Review of Systems  HENT: Positive for congestion, ear pain and sinus pressure. Negative for sore throat.   Eyes: Positive for photophobia. Negative for visual disturbance.  Respiratory: Negative for cough and shortness of breath.   Cardiovascular: Negative for chest pain, palpitations and leg swelling.  Gastrointestinal: Positive for nausea.  Neurological: Positive for headaches. Negative for dizziness, tremors, seizures, syncope, facial asymmetry, speech difficulty, weakness, light-headedness and numbness.  Psychiatric/Behavioral: Positive for sleep disturbance. Negative for suicidal ideas, self-injury and dysphoric mood. The patient is not nervous/anxious.       Objective:   Physical Exam  Nursing note and vitals reviewed. Constitutional: She is oriented to person, place, and time. She appears well-developed and well-nourished. No distress.  HENT:  Head: Normocephalic and atraumatic.  Right Ear: Tympanic membrane and  external ear normal.  Left Ear: Tympanic membrane and external ear normal.  Nose: Right sinus exhibits maxillary sinus tenderness and frontal sinus tenderness. Left sinus exhibits maxillary sinus tenderness and frontal sinus tenderness.  Mouth/Throat: Oropharynx is clear and moist. No oropharyngeal exudate, posterior oropharyngeal edema or posterior oropharyngeal erythema.  Eyes: Conjunctivae and EOM are normal. Pupils are equal, round, and reactive to light.  Neck: Normal range of motion. Neck supple. Carotid bruit is not present. No thyromegaly present.  Cardiovascular: Normal rate, regular rhythm, normal heart sounds and intact distal pulses.  Exam reveals no gallop and no friction rub.   No murmur heard. Pulmonary/Chest: Effort normal and breath sounds normal.  She has no wheezes. She has no rales.  Abdominal: Soft. Bowel sounds are normal. She exhibits no distension and no mass. There is no tenderness. There is no rebound and no guarding.  Lymphadenopathy:    She has no cervical adenopathy.  Neurological: She is alert and oriented to person, place, and time. No cranial nerve deficit. She exhibits normal muscle tone. Coordination normal.  Skin: Skin is warm and dry. No rash noted. She is not diaphoretic. No erythema. No pallor.  Psychiatric: She has a normal mood and affect. Her behavior is normal.      Assessment & Plan:   1. Migraines   2. 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 Chest pain   5. Allergic rhinitis, cause unspecified   6. Essential hypertension, benign    1. Migraines: worsening due to insomnia and stress.  Refill of Imitrex provided. Rx for Metoprolol provided to treat elevated blood pressure and migraines. 2.  Sweating: New.  Obtain labs.  Wellbutrin may be contributing. 3.  Bee allergy: stable; refill of epipen provided. 4.  Allergic Rhinitis: uncontrolled; rx for Flonase provided; recommend Zyrtec or Claritin or Allegra daily. 5.  HTN: New.  rx for Metoprolol ER  25mg  one tablet daily provided; this should also be effective for migraine prevention.  Meds ordered this encounter  Medications  . diphenhydrAMINE (BENADRYL) 25 mg capsule    Sig: Take 25 mg by mouth every 6 (six) hours as needed.  . magnesium gluconate (MAGONATE) 500 MG tablet    Sig: Take 1,500 mg by mouth once.  . Omega-3 Fatty Acids (FISH OIL) 1000 MG CAPS    Sig: Take by mouth.  Marland Kitchen buPROPion (WELLBUTRIN XL) 150 MG 24 hr tablet    Sig: Take 150 mg by mouth daily.  Marland Kitchen ALPRAZolam (XANAX) 1 MG tablet    Sig: Take 1 mg by mouth 4 (four) times daily as needed for anxiety.  . SUMAtriptan (IMITREX) 20 MG/ACT nasal spray    Sig: Place 1 spray (20 mg total) into the nose every 2 (two) hours as needed for migraine. May repeat in 2 hours if headache persists or recurs.    Dispense:  1 Inhaler    Refill:  11  . DISCONTD: metoprolol succinate (TOPROL-XL) 25 MG 24 hr tablet    Sig: Take 1 tablet (25 mg total) by mouth daily.    Dispense:  30 tablet    Refill:  5  . fluticasone (FLONASE) 50 MCG/ACT nasal spray    Sig: Place 2 sprays into both nostrils daily.    Dispense:  16 g    Refill:  11  . EPINEPHrine (EPIPEN) 0.3 mg/0.3 mL SOAJ injection    Sig: Inject 0.3 mLs (0.3 mg total) into the muscle once.    Dispense:  2 Device    Refill:  3   I personally performed the services described in this documentation, which was scribed in my presence.  The recorded information has been reviewed and is accurate.   Nilda Simmer, M.D.  [facility] [address] [place], [place]  [address] [phone] phone [phone] fax

## 2013-01-28 NOTE — Patient Instructions (Signed)
1.  Check blood pressure twice weekly at work; also check pulse/heart rate. 2.  Return in two months for follow-up.

## 2013-01-31 ENCOUNTER — Encounter: Payer: Self-pay | Admitting: Family Medicine

## 2013-02-02 ENCOUNTER — Ambulatory Visit (INDEPENDENT_AMBULATORY_CARE_PROVIDER_SITE_OTHER): Payer: PRIVATE HEALTH INSURANCE | Admitting: Internal Medicine

## 2013-02-02 ENCOUNTER — Telehealth: Payer: Self-pay

## 2013-02-02 VITALS — BP 118/78 | HR 83 | Temp 98.2°F | Resp 16 | Ht 65.4 in | Wt 194.2 lb

## 2013-02-02 DIAGNOSIS — R197 Diarrhea, unspecified: Secondary | ICD-10-CM

## 2013-02-02 DIAGNOSIS — J019 Acute sinusitis, unspecified: Secondary | ICD-10-CM

## 2013-02-02 DIAGNOSIS — R05 Cough: Secondary | ICD-10-CM

## 2013-02-02 MED ORDER — AMOXICILLIN 500 MG PO CAPS: 1000.0000 mg | ORAL_CAPSULE | Freq: Two times a day (BID) | ORAL | Status: DC

## 2013-02-02 MED ORDER — HYDROCODONE-ACETAMINOPHEN 7.5-325 MG/15ML PO SOLN: 5.0000 mL | Freq: Four times a day (QID) | ORAL | Status: DC | PRN

## 2013-02-02 NOTE — Telephone Encounter (Signed)
Patient sounds horrible on the phone. She states she has started a sinus infection. Advised pt to come in for evaluation.

## 2013-02-02 NOTE — Patient Instructions (Addendum)
Gastritis, Adult °Gastritis is soreness and swelling (inflammation) of the lining of the stomach. Gastritis can develop as a sudden onset (acute) or long-term (chronic) condition. If gastritis is not treated, it can lead to stomach bleeding and ulcers. °CAUSES  °Gastritis occurs when the stomach lining is weak or damaged. Digestive juices from the stomach then inflame the weakened stomach lining. The stomach lining may be weak or damaged due to viral or bacterial infections. One common bacterial infection is the Helicobacter pylori infection. Gastritis can also result from excessive alcohol consumption, taking certain medicines, or having too much acid in the stomach.  °SYMPTOMS  °In some cases, there are no symptoms. When symptoms are present, they may include: °· Pain or a burning sensation in the upper abdomen. °· Nausea. °· Vomiting. °· An uncomfortable feeling of fullness after eating. °DIAGNOSIS  °Your caregiver may suspect you have gastritis based on your symptoms and a physical exam. To determine the cause of your gastritis, your caregiver may perform the following: °· Blood or stool tests to check for the H pylori bacterium. °· Gastroscopy. A thin, flexible tube (endoscope) is passed down the esophagus and into the stomach. The endoscope has a light and camera on the end. Your caregiver uses the endoscope to view the inside of the stomach. °· Taking a tissue sample (biopsy) from the stomach to examine under a microscope. °TREATMENT  °Depending on the cause of your gastritis, medicines may be prescribed. If you have a bacterial infection, such as an H pylori infection, antibiotics may be given. If your gastritis is caused by too much acid in the stomach, H2 blockers or antacids may be given. Your caregiver may recommend that you stop taking aspirin, ibuprofen, or other nonsteroidal anti-inflammatory drugs (NSAIDs). °HOME CARE INSTRUCTIONS °· Only take over-the-counter or prescription medicines as directed by  your caregiver. °· If you were given antibiotic medicines, take them as directed. Finish them even if you start to feel better. °· Drink enough fluids to keep your urine clear or pale yellow. °· Avoid foods and drinks that make your symptoms worse, such as: °· Caffeine or alcoholic drinks. °· Chocolate. °· Peppermint or mint flavorings. °· Garlic and onions. °· Spicy foods. °· Citrus fruits, such as oranges, lemons, or limes. °· Tomato-based foods such as sauce, chili, salsa, and pizza. °· Fried and fatty foods. °· Eat small, frequent meals instead of large meals. °SEEK IMMEDIATE MEDICAL CARE IF:  °· You have black or dark red stools. °· You vomit blood or material that looks like coffee grounds. °· You are unable to keep fluids down. °· Your abdominal pain gets worse. °· You have a fever. °· You do not feel better after 1 week. °· You have any other questions or concerns. °MAKE SURE YOU: °· Understand these instructions. °· Will watch your condition. °· Will get help right away if you are not doing well or get worse. °Document Released: 02/07/2001 Document Revised: 08/15/2011 Document Reviewed: 03/29/2011 °ExitCare® Patient Information ©2014 [employer]. ° °Sinusitis °Sinusitis is redness, soreness, and swelling (inflammation) of the paranasal sinuses. Paranasal sinuses are air pockets within the bones of your face (beneath the eyes, the middle of the forehead, or above the eyes). In healthy paranasal sinuses, mucus is able to drain out, and air is able to circulate through them by way of your nose. However, when your paranasal sinuses are inflamed, mucus and air can become trapped. This can allow bacteria and other germs to grow and cause infection. °  can develop quickly and last only a short time (acute) or continue over a long period (chronic). Sinusitis that lasts for more than 12 weeks is considered chronic.  CAUSES  Causes of sinusitis include:  Allergies.  Structural abnormalities, such as  displacement of the cartilage that separates your nostrils (deviated septum), which can decrease the air flow through your nose and sinuses and affect sinus drainage.  Functional abnormalities, such as when the small hairs (cilia) that line your sinuses and help remove mucus do not work properly or are not present. SYMPTOMS  Symptoms of acute and chronic sinusitis are the same. The primary symptoms are pain and pressure around the affected sinuses. Other symptoms include:  Upper toothache.  Earache.  Headache.  Bad breath.  Decreased sense of smell and taste.  A cough, which worsens when you are lying flat.  Fatigue.  Fever.  Thick drainage from your nose, which often is green and may contain pus (purulent).  Swelling and warmth over the affected sinuses. DIAGNOSIS  Your caregiver will perform a physical exam. During the exam, your caregiver may:  Look in your nose for signs of abnormal growths in your nostrils (nasal polyps).  Tap over the affected sinus to check for signs of infection.  View the inside of your sinuses (endoscopy) with a special imaging device with a light attached (endoscope), which is inserted into your sinuses. If your caregiver suspects that you have chronic sinusitis, one or more of the following tests may be recommended:  Allergy tests.  Nasal culture A sample of mucus is taken from your nose and sent to a lab and screened for bacteria.  Nasal cytology A sample of mucus is taken from your nose and examined by your caregiver to determine if your sinusitis is related to an allergy. TREATMENT  Most cases of acute sinusitis are related to a viral infection and will resolve on their own within 10 days. Sometimes medicines are prescribed to help relieve symptoms (pain medicine, decongestants, nasal steroid sprays, or saline sprays).  However, for sinusitis related to a bacterial infection, your caregiver will prescribe antibiotic medicines. These are  medicines that will help kill the bacteria causing the infection.  Rarely, sinusitis is caused by a fungal infection. In theses cases, your caregiver will prescribe antifungal medicine. For some cases of chronic sinusitis, surgery is needed. Generally, these are cases in which sinusitis recurs more than 3 times per year, despite other treatments. HOME CARE INSTRUCTIONS   Drink plenty of water. Water helps thin the mucus so your sinuses can drain more easily.  Use a humidifier.  Inhale steam 3 to 4 times a day (for example, sit in the bathroom with the shower running).  Apply a warm, moist washcloth to your face 3 to 4 times a day, or as directed by your caregiver.  Use saline nasal sprays to help moisten and clean your sinuses.  Take over-the-counter or prescription medicines for pain, discomfort, or fever only as directed by your caregiver. SEEK IMMEDIATE MEDICAL CARE IF:  You have increasing pain or severe headaches.  You have nausea, vomiting, or drowsiness.  You have swelling around your face.  You have vision problems.  You have a stiff neck.  You have difficulty breathing. MAKE SURE YOU:   Understand these instructions.  Will watch your condition.  Will get help right away if you are not doing well or get worse. Document Released: 02/13/2005 Document Revised: 05/08/2011 Document Reviewed: 02/28/2011 [facility] Patient Information 2014 [place], [place].  Cough, Adult  A cough is a reflex that helps clear your throat and airways. It can help heal the body or may be a reaction to an irritated airway. A cough may only last 2 or 3 weeks (acute) or may last more than 8 weeks (chronic).  CAUSES Acute cough:  Viral or bacterial infections. Chronic cough:  Infections.  Allergies.  Asthma.  Post-nasal drip.  Smoking.  Heartburn or acid reflux.  Some medicines.  Chronic lung problems (COPD).  Cancer. SYMPTOMS   Cough.  Fever.  Chest pain.  Increased  breathing rate.  High-pitched whistling sound when breathing (wheezing).  Colored mucus that you cough up (sputum). TREATMENT   A bacterial cough may be treated with antibiotic medicine.  A viral cough must run its course and will not respond to antibiotics.  Your caregiver may recommend other treatments if you have a chronic cough. HOME CARE INSTRUCTIONS   Only take over-the-counter or prescription medicines for pain, discomfort, or fever as directed by your caregiver. Use cough suppressants only as directed by your caregiver.  Use a cold steam vaporizer or humidifier in your bedroom or home to help loosen secretions.  Sleep in a semi-upright position if your cough is worse at night.  Rest as needed.  Stop smoking if you smoke. SEEK IMMEDIATE MEDICAL CARE IF:   You have pus in your sputum.  Your cough starts to worsen.  You cannot control your cough with suppressants and are losing sleep.  You begin coughing up blood.  You have difficulty breathing.  You develop pain which is getting worse or is uncontrolled with medicine.  You have a fever. MAKE SURE YOU:   Understand these instructions.  Will watch your condition.  Will get help right away if you are not doing well or get worse. Document Released: 08/12/2010 Document Revised: 05/08/2011 Document Reviewed: 08/12/2010 [facility] Patient Information 2014 [place], [place].

## 2013-02-02 NOTE — Telephone Encounter (Signed)
Pt. Saw Dr. Katrinka Blazing on Dec. 2.  She had a head cold then and ear pain.  Now she has no voice and coughing up junk.  Says she called Friday and left a message, but I do not see that anyone took a message.  Wants anyone to please call her in something.  [phone]

## 2013-02-02 NOTE — Progress Notes (Signed)
   Subjective:    Patient ID: Karen Holder, female    DOB: 10-29-1980, 32 y.o.   MRN: [id number]  HPI 32 yr old woman is here today with complaints of nausea, sinus press, headache, chest congestion, nasal congestion and a cough. She has been coughing and blowing out green phlegm. She also would like to go over her lab results from Tuesday, January 28, 2013. She wants to know what could be making her liver enzymes elevated.   No sob,cp. No abdomenal pain. Diarrhea brown, watery, 7 stools yesterday. 32 yo is sick with same and goes to day care.  Review of Systems     Objective:   Physical Exam  Constitutional: She is oriented to person, place, and time. She appears well-developed and well-nourished.  HENT:  Head: Normocephalic.  Right Ear: External ear normal.  Left Ear: External ear normal.  Nose: Mucosal edema, rhinorrhea and sinus tenderness present. Right sinus exhibits maxillary sinus tenderness and frontal sinus tenderness. Left sinus exhibits maxillary sinus tenderness and frontal sinus tenderness.  Mouth/Throat: No uvula swelling. Posterior oropharyngeal edema present.  Neck: Normal range of motion. Neck supple. No tracheal deviation present. No thyromegaly present.  Pulmonary/Chest: Effort normal.  Lymphadenopathy:    She has cervical adenopathy.  Neurological: She is alert and oriented to person, place, and time. She exhibits normal muscle tone. Coordination normal.  Psychiatric: She has a normal mood and affect. Her behavior is normal.          Assessment & Plan:  Sinusitis Viral gastroenteritis Consider repeat LFTs 1 month

## 2013-08-02 ENCOUNTER — Other Ambulatory Visit: Payer: Self-pay | Admitting: Family Medicine

## 2013-09-22 ENCOUNTER — Other Ambulatory Visit: Payer: Self-pay | Admitting: Family Medicine

## 2013-10-06 ENCOUNTER — Other Ambulatory Visit: Payer: Self-pay | Admitting: Family Medicine

## 2013-12-10 ENCOUNTER — Ambulatory Visit (INDEPENDENT_AMBULATORY_CARE_PROVIDER_SITE_OTHER): Payer: PRIVATE HEALTH INSURANCE | Admitting: Family Medicine

## 2013-12-10 VITALS — BP 130/78 | HR 96 | Temp 98.6°F | Resp 17 | Ht 68.75 in | Wt 198.0 lb

## 2013-12-10 DIAGNOSIS — R059 Cough, unspecified: Secondary | ICD-10-CM

## 2013-12-10 DIAGNOSIS — J358 Other chronic diseases of tonsils and adenoids: Secondary | ICD-10-CM

## 2013-12-10 DIAGNOSIS — R5383 Other fatigue: Secondary | ICD-10-CM

## 2013-12-10 DIAGNOSIS — J029 Acute pharyngitis, unspecified: Secondary | ICD-10-CM

## 2013-12-10 DIAGNOSIS — R05 Cough: Secondary | ICD-10-CM

## 2013-12-10 LAB — POCT CBC
GRANULOCYTE PERCENT: 66.1 % (ref 37–80)
HCT, POC: 47 % (ref 37.7–47.9)
Hemoglobin: 15.3 g/dL (ref 12.2–16.2)
Lymph, poc: 2.6 (ref 0.6–3.4)
MCH: 29.3 pg (ref 27–31.2)
MCHC: 32.6 g/dL (ref 31.8–35.4)
MCV: 90 fL (ref 80–97)
MID (CBC): 0.3 (ref 0–0.9)
MPV: 7.4 fL (ref 0–99.8)
PLATELET COUNT, POC: 299 10*3/uL (ref 142–424)
POC Granulocyte: 5.7 (ref 2–6.9)
POC LYMPH %: 30.5 % (ref 10–50)
POC MID %: 3.4 % (ref 0–12)
RBC: 5.22 M/uL (ref 4.04–5.48)
RDW, POC: 13.5 %
WBC: 8.6 10*3/uL (ref 4.6–10.2)

## 2013-12-10 LAB — COMPREHENSIVE METABOLIC PANEL
ALBUMIN: 4.6 g/dL (ref 3.5–5.2)
ALK PHOS: 59 U/L (ref 39–117)
ALT: 39 U/L — ABNORMAL HIGH (ref 0–35)
AST: 29 U/L (ref 0–37)
BILIRUBIN TOTAL: 0.5 mg/dL (ref 0.2–1.2)
BUN: 10 mg/dL (ref 6–23)
CO2: 27 mEq/L (ref 19–32)
Calcium: 10.1 mg/dL (ref 8.4–10.5)
Chloride: 101 mEq/L (ref 96–112)
Creat: 0.8 mg/dL (ref 0.50–1.10)
Glucose, Bld: 84 mg/dL (ref 70–99)
POTASSIUM: 4.7 meq/L (ref 3.5–5.3)
SODIUM: 137 meq/L (ref 135–145)
Total Protein: 7.6 g/dL (ref 6.0–8.3)

## 2013-12-10 LAB — POCT RAPID STREP A (OFFICE): Rapid Strep A Screen: NEGATIVE

## 2013-12-10 MED ORDER — IPRATROPIUM BROMIDE 0.03 % NA SOLN: 2.0000 | Freq: Four times a day (QID) | NASAL | Status: DC

## 2013-12-10 MED ORDER — HYDROCODONE-HOMATROPINE 5-1.5 MG/5ML PO SYRP: 5.0000 mL | ORAL_SOLUTION | Freq: Three times a day (TID) | ORAL | Status: DC | PRN

## 2013-12-10 NOTE — Progress Notes (Signed)
[facility] [address] [place] [address] [id number]  Date:  12/10/2013   Name:  Karen Holder   DOB:  [birth year]   MRN:  [id number]  PCP:  No primary provider on file.    Chief Complaint: URI   History of Present Illness:  Karen Holder is a 33 y.o. very pleasant female patient who presents with the following:  She is here today with illness.   About 3 weeks ago she noted a problem with her left tonsil- she noted a "white ball in the tonsil" that she was able to remove with a q-tip.  This seemed to leave a crater in her tonsil.  She "has not been feeling that great" since then but did not have any other specific sx.   About a week ago she noted nausea and diarrhea after eating.  Over the weekend they were out of town for a family vacation, she took pepto- bismol and this did seem to help.  Her GI sx are now resolved. She also noted some pain in her left abdomen which is now better.  Never had any vomiting.  No UTI sx.   Now, over the last 5 days she has noted sinus congestion, discolored nasal mucus and a productive cough.  dayquil and nyquil do not seem to help.  She has checked her temp and not had a fever, but she has felt feverish   She does have a mild ST.   She also gets some cracking at the corners of her mouth over the last month She is on OCP, LMP 9/23.    Patient Active Problem List   Diagnosis Date Noted  . Acute blood loss anemia 03/05/2012  . Gestational hypertension 03/03/2012  . Postpartum care following vaginal delivery (1/5) 03/03/2012    Past Medical History  Diagnosis Date  . Depression   . Cancer   . Allergy   . Migraines     onset age 82; previous [facility] consultation.  Previous trial fo Topamax, Amitriptyline.    Past Surgical History  Procedure Laterality Date  . Mole removal      History  Substance Use Topics  . Smoking status: Former Games developer  . Smokeless tobacco: Not on file  . Alcohol Use:  No    Family History  Problem Relation Age of Onset  . Other Neg Hx   . Hypertension Father   . Hyperlipidemia Father     Allergies  Allergen Reactions  . Other Swelling    Bee stings   . Eggs Or Egg-Derived Products Diarrhea    Medication list has been reviewed and updated.  Current Outpatient Prescriptions on File Prior to Visit  Medication Sig Dispense Refill  . ALPRAZolam (XANAX) 1 MG tablet Take 1 mg by mouth 4 (four) times daily as needed for anxiety.      . calcium carbonate (TUMS - DOSED IN MG ELEMENTAL CALCIUM) 500 MG chewable tablet Chew 1 tablet by mouth daily. heartburn      . EPINEPHrine (EPIPEN) 0.3 mg/0.3 mL SOAJ injection Inject 0.3 mLs (0.3 mg total) into the muscle once.  2 Device  3  . fluticasone (FLONASE) 50 MCG/ACT nasal spray Place 2 sprays into both nostrils daily.  16 g  11  . iron polysaccharides (NIFEREX) 150 MG capsule Take 1 capsule (150 mg total) by mouth daily.  30 capsule  0  . magnesium gluconate (MAGONATE) 500 MG tablet Take 1,500 mg by mouth  once.      . Omega-3 Fatty Acids (FISH OIL) 1000 MG CAPS Take by mouth.      . Prenatal Vit-Fe Fumarate-FA (PRENATAL MULTIVITAMIN) TABS Take 1 tablet by mouth daily.      . SUMAtriptan (IMITREX) 20 MG/ACT nasal spray Place 1 spray (20 mg total) into the nose every 2 (two) hours as needed for migraine. May repeat in 2 hours if headache persists or recurs.  1 Inhaler  11  . zolpidem (AMBIEN CR) 12.5 MG CR tablet Take 12.5 mg by mouth at bedtime as needed. sleep      . hydrochlorothiazide (MICROZIDE) 12.5 MG capsule Take 1 capsule (12.5 mg total) by mouth daily.  7 capsule  0   No current facility-administered medications on file prior to visit.    Review of Systems:  As per HPI- otherwise negative. Her mother had advised her to try some topical antifungal to her mouth which did seem to help  Physical Examination: Filed Vitals:   12/10/13 0918  BP: 130/78  Pulse: 117  Temp: 98.6 F (37 C)  Resp: 17    Filed Vitals:   12/10/13 0918  Height: 5' 8.75" (1.746 m)  Weight: 198 lb (89.812 kg)   Body mass index is 29.46 kg/(m^2). Ideal Body Weight: Weight in (lb) to have BMI = 25: 167.7  GEN: WDWN, NAD, Non-toxic, A & O x 3, overweight, looks well HEENT: Atraumatic, Normocephalic. Neck supple. No masses, No LAD.  Bilateral TM wnl, oropharynx normal.  PEERL,EOMI.   Mild angular celitis bilaterally  Ears and Nose: No external deformity. CV: RRR, No M/G/R. No JVD. No thrill. No extra heart sounds. PULM: CTA B, no wheezes, crackles, rhonchi. No retractions. No resp. distress. No accessory muscle use. ABD: S,  ND, +BS. No rebound. No HSM.  She has minimal left upper quadrant tenderness  EXTR: No c/c/e NEURO Normal gait.  PSYCH: Normally interactive. Conversant. Not depressed or anxious appearing.  Calm demeanor.   Results for orders placed in visit on 12/10/13  POCT CBC      Result Value Ref Range   WBC 8.6  4.6 - 10.2 K/uL   Lymph, poc 2.6  0.6 - 3.4   POC LYMPH PERCENT 30.5  10 - 50 %L   MID (cbc) 0.3  0 - 0.9   POC MID % 3.4  0 - 12 %M   POC Granulocyte 5.7  2 - 6.9   Granulocyte percent 66.1  37 - 80 %G   RBC 5.22  4.04 - 5.48 M/uL   Hemoglobin 15.3  12.2 - 16.2 g/dL   HCT, POC 16.147.0  09.637.7 - 47.9 %   MCV 90.0  80 - 97 fL   MCH, POC 29.3  27 - 31.2 pg   MCHC 32.6  31.8 - 35.4 g/dL   RDW, POC 04.513.5     Platelet Count, POC 299  142 - 424 K/uL   MPV 7.4  0 - 99.8 fL  POCT RAPID STREP A (OFFICE)      Result Value Ref Range   Rapid Strep A Screen Negative  Negative    Assessment and Plan: Acute pharyngitis, unspecified pharyngitis type - Plan: POCT CBC, POCT rapid strep A, Comprehensive metabolic panel, Epstein-Barr virus VCA antibody panel, ipratropium (ATROVENT) 0.03 % nasal spray  Other fatigue - Plan: Comprehensive metabolic panel, Epstein-Barr virus VCA antibody panel  Tonsillith  Cough - Plan: HYDROcodone-homatropine (HYCODAN) 5-1.5 MG/5ML syrup  Likely viral illness.   She would  like to use some cough syrup to help her sleep.  Hycodan rx. Do not combine with Remus Lofflerambien (which she very rarely uses) or xanax.  atrovent nasal as needed Consider possible mono with ST and left abdomen tenderness.  Check EBV and CMP.   Rest at home for the next couple of days Will plan further follow- up pending labs.   Signed Abbe AmsterdamJessica Copland, MD

## 2013-12-10 NOTE — Patient Instructions (Signed)
I will be in touch with your other labs as soon as possible. Use the cough syrup as needed but remember it will make you sleepy.  You can also continue to use OTC ibuprofen, etc as needed.  Use the nasal spray as needed for runny nose and post- nasal drop. Let me know if you do not feel better in the next couple of days- Sooner if worse.

## 2013-12-11 ENCOUNTER — Telehealth: Payer: Self-pay | Admitting: *Deleted

## 2013-12-11 LAB — EPSTEIN-BARR VIRUS VCA ANTIBODY PANEL
EBV NA IgG: 244 U/mL — ABNORMAL HIGH (ref <18.0)
EBV VCA IGM: 11.7 U/mL (ref <36.0)
EBV VCA IgG: 200 U/mL — ABNORMAL HIGH (ref <18.0)

## 2013-12-11 NOTE — Telephone Encounter (Signed)
Results for orders placed in visit on 12/10/13  COMPREHENSIVE METABOLIC PANEL      Result Value Ref Range   Sodium 137  135 - 145 mEq/L   Potassium 4.7  3.5 - 5.3 mEq/L   Chloride 101  96 - 112 mEq/L   CO2 27  19 - 32 mEq/L   Glucose, Bld 84  70 - 99 mg/dL   BUN 10  6 - 23 mg/dL   Creat 4.540.80  0.980.50 - 1.191.10 mg/dL   Total Bilirubin 0.5  0.2 - 1.2 mg/dL   Alkaline Phosphatase 59  39 - 117 U/L   AST 29  0 - 37 U/L   ALT 39 (*) 0 - 35 U/L   Total Protein 7.6  6.0 - 8.3 g/dL   Albumin 4.6  3.5 - 5.2 g/dL   Calcium 14.710.1  8.4 - 82.910.5 mg/dL  EPSTEIN-BARR VIRUS VCA ANTIBODY PANEL      Result Value Ref Range   EBV VCA IgG 200.0 (*) <18.0 U/mL   EBV VCA IgM 11.7  <36.0 U/mL   EBV EA IgG <5.0  <9.0 U/mL   EBV NA IgG 244.0 (*) <18.0 U/mL  POCT CBC      Result Value Ref Range   WBC 8.6  4.6 - 10.2 K/uL   Lymph, poc 2.6  0.6 - 3.4   POC LYMPH PERCENT 30.5  10 - 50 %L   MID (cbc) 0.3  0 - 0.9   POC MID % 3.4  0 - 12 %M   POC Granulocyte 5.7  2 - 6.9   Granulocyte percent 66.1  37 - 80 %G   RBC 5.22  4.04 - 5.48 M/uL   Hemoglobin 15.3  12.2 - 16.2 g/dL   HCT, POC 56.247.0  13.037.7 - 47.9 %   MCV 90.0  80 - 97 fL   MCH, POC 29.3  27 - 31.2 pg   MCHC 32.6  31.8 - 35.4 g/dL   RDW, POC 86.513.5     Platelet Count, POC 299  142 - 424 K/uL   MPV 7.4  0 - 99.8 fL  POCT RAPID STREP A (OFFICE)      Result Value Ref Range   Rapid Strep A Screen Negative  Negative   Called her back.  Went over available labs.  I do not think that abx are likely to help her at this time.  Recommended continued supportive therapy, and she can add OTC tylenol and/ or ibuprofen as needed.  Will be in touch with throat culture results

## 2013-12-11 NOTE — Telephone Encounter (Signed)
Pt called concerning labs. Reports feeling no better. Please advise.

## 2013-12-12 ENCOUNTER — Telehealth: Payer: Self-pay

## 2013-12-12 DIAGNOSIS — J40 Bronchitis, not specified as acute or chronic: Secondary | ICD-10-CM

## 2013-12-12 MED ORDER — CEFDINIR 300 MG PO CAPS
300.0000 mg | ORAL_CAPSULE | Freq: Two times a day (BID) | ORAL | Status: DC
Start: 2013-12-12 — End: 2014-05-01

## 2013-12-12 NOTE — Telephone Encounter (Signed)
Need your approval or denial

## 2013-12-12 NOTE — Telephone Encounter (Signed)
Called her- this is fine.  Will send the note to her mychart account so she can print it out at home.  Also, will send an abx rx to her drug store to fill and use if not better in the next couple of days. She notes that she is still coughing and "bringing up a lot of green stuff from my lungs."    Don't forget abx can effect her OCP  Meds ordered this encounter  Medications  . cefdinir (OMNICEF) 300 MG capsule    Sig: Take 1 capsule (300 mg total) by mouth 2 (two) times daily.    Dispense:  20 capsule    Refill:  0

## 2013-12-12 NOTE — Telephone Encounter (Signed)
Patient needs her work note extended to cover Wednesday-Friday

## 2013-12-17 ENCOUNTER — Encounter: Payer: Self-pay | Admitting: Family Medicine

## 2013-12-17 NOTE — Telephone Encounter (Signed)
Called to check on her. She thinks that she is doing ok, has been on abx now for 4 days.  Encouraged her to come in for recheck and she says she will if not getting better

## 2014-04-21 ENCOUNTER — Other Ambulatory Visit: Payer: Self-pay | Admitting: Obstetrics & Gynecology

## 2014-04-27 ENCOUNTER — Encounter (HOSPITAL_COMMUNITY)
Admission: RE | Admit: 2014-04-27 | Discharge: 2014-04-27 | Disposition: A | Discharge status: Home / Self Care | Payer: No Typology Code available for payment source | Source: Ambulatory Visit | Attending: Obstetrics & Gynecology | Admitting: Obstetrics & Gynecology

## 2014-04-27 ENCOUNTER — Encounter (HOSPITAL_COMMUNITY): Payer: Self-pay

## 2014-04-27 DIAGNOSIS — Z01818 Encounter for other preprocedural examination: Secondary | ICD-10-CM | POA: Insufficient documentation

## 2014-04-27 DIAGNOSIS — R102 Pelvic and perineal pain: Secondary | ICD-10-CM | POA: Diagnosis not present

## 2014-04-27 HISTORY — DX: Gastro-esophageal reflux disease without esophagitis: K21.9

## 2014-04-27 HISTORY — DX: Essential (primary) hypertension: I10

## 2014-04-27 LAB — CBC
HCT: 42.4 % (ref 36.0–46.0)
HEMOGLOBIN: 14.6 g/dL (ref 12.0–15.0)
MCH: 31.2 pg (ref 26.0–34.0)
MCHC: 34.4 g/dL (ref 30.0–36.0)
MCV: 90.6 fL (ref 78.0–100.0)
Platelets: 279 10*3/uL (ref 150–400)
RBC: 4.68 MIL/uL (ref 3.87–5.11)
RDW: 12.8 % (ref 11.5–15.5)
WBC: 9.3 10*3/uL (ref 4.0–10.5)

## 2014-04-27 NOTE — Patient Instructions (Signed)
Your procedure is scheduled on:05/01/14  Enter through the Main Entrance at :10:00 am Pick up desk phone and dial [phone] and inform us of your arrival.  Please call [phone] if you have any problems the morning of surgery.  Remember: Do not eat food after midnight:Thursday Clear liquids are ok until:7am on Fri   You may brush your teeth the morning of surgery.  Take these meds the morning of surgery with a sip of water: Effexor  DO NOT wear jewelry, eye make-up, lipstick,body lotion, or dark fingernail polish.  (Polished toes are ok) You may wear deodorant.  If you are to be admitted after surgery, leave suitcase in car until your room has been assigned. Patients discharged on the day of surgery will not be allowed to drive home. Wear loose fitting, comfortable clothes for your ride home.

## 2014-04-30 NOTE — H&P (Addendum)
Karen Holder is an 34 y.o. female G1P1001, is here for diagnostic laparoscopy for pelvic pain off and on since several years and getting really worse more recently.  She had first mentioned of it in 2012 with acute on chronic pelvic pain, hx of GI infection in early 20s, constipation without GI bleeding. Denied dysmenorrhea but was on extended OCs for menstrual migraines. Noted deep dyspareunia, no PID/STD hx.  She had a spontaneous full term pregnancy in 2013, with vaginal birth in Jan'14.  She was back on OCs and didn't notice pelvic pain then but stopped OCs in mid-2015 due to side effects, incl worsening migraines, decreased libido and desire to conceive.  She was seen in office again in Jan'16 for pelvic pain, now off OCs since trying to conceive, c/o dull pelvic pain with superimposed suprapubic pain, dyspareunia, worsening migraines and worsening constipation.  She is taking Imitrex, Narcotics, Fioricet, Phenergan for migraines regularly, Effexor for depression.   Paps normal, HPV negative in Apr'15.   FamHx Migraines (mother), HTN, MI.   Past Medical History  Diagnosis Date  . Depression   . Allergy   . Migraines     onset age 89; previous [facility] consultation.  Previous trial fo Topamax, Amitriptyline.  . Hypertension 2014    PIH  . GERD (gastroesophageal reflux disease)     Past Surgical History  Procedure Laterality Date  . Mole removal    . No past surgeries      Family History  Problem Relation Age of Onset  . Other Neg Hx   . Hypertension Father   . Hyperlipidemia Father     Social History:  reports that she has quit smoking. She does not have any smokeless tobacco history on file. She reports that she does not drink alcohol or use illicit drugs.  Allergies:  Allergies  Allergen Reactions  . Other Swelling    Bee stings   . Eggs Or Egg-Derived Products Diarrhea and Nausea And Vomiting    No prescriptions prior to admission    ROS neg   Physical Exam   A&O x 3, no acute distress. Pleasant HEENT neg, no thyromegaly Lungs CTA bilat CV RRR, S1S2 normal Abdo soft, non tender, non acute Extr no edema/ tenderness Pelvic Uterus and cervix normal but slight tenderness noted. No adnexal masses.    Assessment/Plan: 34 yo, G1P1, long standing pelvic pain, here for laparoscopy for worsening pain. Possible operative laparoscopy and possible laparotomy reviewed.  Risks/complications of surgery reviewed incl infection, bleeding, damage to internal organs including bladder, bowels, ureters, blood vessels, other risks from anesthesia, VTE and delayed complications of any surgery, complications in future surgery reviewed.   Karen Holder R 04/30/2014, 5:14 PM

## 2014-05-01 ENCOUNTER — Ambulatory Visit (HOSPITAL_COMMUNITY): Payer: No Typology Code available for payment source | Admitting: Anesthesiology

## 2014-05-01 ENCOUNTER — Encounter (HOSPITAL_COMMUNITY)
Admission: RE | Disposition: A | Discharge status: Home / Self Care | Payer: Self-pay | Source: Ambulatory Visit | Attending: Obstetrics & Gynecology

## 2014-05-01 ENCOUNTER — Encounter (HOSPITAL_COMMUNITY): Payer: Self-pay | Admitting: Anesthesiology

## 2014-05-01 ENCOUNTER — Ambulatory Visit (HOSPITAL_COMMUNITY)
Admission: RE | Admit: 2014-05-01 | Discharge: 2014-05-01 | Disposition: A | Discharge status: Home / Self Care | Payer: No Typology Code available for payment source | Source: Ambulatory Visit | Attending: Obstetrics & Gynecology | Admitting: Obstetrics & Gynecology

## 2014-05-01 DIAGNOSIS — F329 Major depressive disorder, single episode, unspecified: Secondary | ICD-10-CM | POA: Diagnosis not present

## 2014-05-01 DIAGNOSIS — N803 Endometriosis of pelvic peritoneum: Secondary | ICD-10-CM | POA: Diagnosis not present

## 2014-05-01 DIAGNOSIS — G43909 Migraine, unspecified, not intractable, without status migrainosus: Secondary | ICD-10-CM | POA: Diagnosis not present

## 2014-05-01 DIAGNOSIS — Z79899 Other long term (current) drug therapy: Secondary | ICD-10-CM | POA: Diagnosis not present

## 2014-05-01 DIAGNOSIS — N946 Dysmenorrhea, unspecified: Secondary | ICD-10-CM | POA: Diagnosis not present

## 2014-05-01 DIAGNOSIS — R102 Pelvic and perineal pain unspecified side: Secondary | ICD-10-CM | POA: Diagnosis present

## 2014-05-01 DIAGNOSIS — N949 Unspecified condition associated with female genital organs and menstrual cycle: Secondary | ICD-10-CM | POA: Diagnosis present

## 2014-05-01 DIAGNOSIS — N736 Female pelvic peritoneal adhesions (postinfective): Secondary | ICD-10-CM | POA: Insufficient documentation

## 2014-05-01 DIAGNOSIS — N941 Dyspareunia: Secondary | ICD-10-CM | POA: Diagnosis not present

## 2014-05-01 DIAGNOSIS — G8929 Other chronic pain: Secondary | ICD-10-CM | POA: Diagnosis not present

## 2014-05-01 DIAGNOSIS — I10 Essential (primary) hypertension: Secondary | ICD-10-CM | POA: Diagnosis not present

## 2014-05-01 DIAGNOSIS — K219 Gastro-esophageal reflux disease without esophagitis: Secondary | ICD-10-CM | POA: Insufficient documentation

## 2014-05-01 DIAGNOSIS — E669 Obesity, unspecified: Secondary | ICD-10-CM | POA: Diagnosis not present

## 2014-05-01 DIAGNOSIS — Z87891 Personal history of nicotine dependence: Secondary | ICD-10-CM | POA: Diagnosis not present

## 2014-05-01 HISTORY — DX: Pelvic and perineal pain: R10.2

## 2014-05-01 HISTORY — PX: LAPAROSCOPIC LYSIS OF ADHESIONS: SHX5905

## 2014-05-01 HISTORY — DX: Other chronic pain: G89.29

## 2014-05-01 HISTORY — PX: LAPAROSCOPY: SHX197

## 2014-05-01 LAB — PREGNANCY, URINE: Preg Test, Ur: NEGATIVE

## 2014-05-01 SURGERY — LAPAROSCOPY, DIAGNOSTIC
Anesthesia: General | Site: Abdomen

## 2014-05-01 MED ORDER — SCOPOLAMINE 1 MG/3DAYS TD PT72
1.0000 | MEDICATED_PATCH | Freq: Once | TRANSDERMAL | Status: DC
  Administered 2014-05-01: 1.5 mg via TRANSDERMAL

## 2014-05-01 MED ORDER — MIDAZOLAM HCL 2 MG/2ML IJ SOLN
INTRAMUSCULAR | Status: DC | PRN
  Administered 2014-05-01: 2 mg via INTRAVENOUS

## 2014-05-01 MED ORDER — BUPIVACAINE HCL (PF) 0.25 % IJ SOLN
INTRAMUSCULAR | Status: DC | PRN
  Administered 2014-05-01: 11 mL
  Administered 2014-05-01: 17 mL

## 2014-05-01 MED ORDER — FENTANYL CITRATE 0.05 MG/ML IJ SOLN
INTRAMUSCULAR | Status: AC
  Filled 2014-05-01: qty 5

## 2014-05-01 MED ORDER — DEXAMETHASONE SODIUM PHOSPHATE 10 MG/ML IJ SOLN
INTRAMUSCULAR | Status: DC | PRN
  Administered 2014-05-01: 4 mg via INTRAVENOUS

## 2014-05-01 MED ORDER — SUCCINYLCHOLINE CHLORIDE 20 MG/ML IJ SOLN
INTRAMUSCULAR | Status: DC | PRN
  Administered 2014-05-01: 100 mg via INTRAVENOUS

## 2014-05-01 MED ORDER — LIDOCAINE HCL (CARDIAC) 20 MG/ML IV SOLN
INTRAVENOUS | Status: AC
  Filled 2014-05-01: qty 5

## 2014-05-01 MED ORDER — OXYCODONE-ACETAMINOPHEN 5-325 MG PO TABS: 1.0000 | ORAL_TABLET | Freq: Four times a day (QID) | ORAL | Status: DC | PRN

## 2014-05-01 MED ORDER — FENTANYL CITRATE 0.05 MG/ML IJ SOLN
INTRAMUSCULAR | Status: DC | PRN
  Administered 2014-05-01: 100 ug via INTRAVENOUS
  Administered 2014-05-01: 50 ug via INTRAVENOUS
  Administered 2014-05-01 (×2): 100 ug via INTRAVENOUS

## 2014-05-01 MED ORDER — GLYCOPYRROLATE 0.2 MG/ML IJ SOLN
INTRAMUSCULAR | Status: AC
  Filled 2014-05-01: qty 1

## 2014-05-01 MED ORDER — CEFAZOLIN SODIUM 1-5 GM-% IV SOLN
1.0000 g | INTRAVENOUS | Status: DC
  Filled 2014-05-01: qty 50

## 2014-05-01 MED ORDER — SODIUM CHLORIDE 0.9 % IJ SOLN
INTRAMUSCULAR | Status: AC
  Filled 2014-05-01: qty 10

## 2014-05-01 MED ORDER — LACTATED RINGERS IV SOLN
INTRAVENOUS | Status: DC
Start: 2014-05-01 — End: 2014-05-01
  Administered 2014-05-01 (×3): via INTRAVENOUS

## 2014-05-01 MED ORDER — GLYCOPYRROLATE 0.2 MG/ML IJ SOLN
INTRAMUSCULAR | Status: AC
  Filled 2014-05-01: qty 3

## 2014-05-01 MED ORDER — HEPARIN SODIUM (PORCINE) 5000 UNIT/ML IJ SOLN
INTRAMUSCULAR | Status: AC
  Filled 2014-05-01: qty 1

## 2014-05-01 MED ORDER — ONDANSETRON HCL 4 MG/2ML IJ SOLN
INTRAMUSCULAR | Status: AC
  Filled 2014-05-01: qty 2

## 2014-05-01 MED ORDER — PROPOFOL 10 MG/ML IV BOLUS
INTRAVENOUS | Status: DC | PRN
  Administered 2014-05-01: 200 mg via INTRAVENOUS

## 2014-05-01 MED ORDER — ONDANSETRON HCL 4 MG/2ML IJ SOLN
INTRAMUSCULAR | Status: DC | PRN
  Administered 2014-05-01: 4 mg via INTRAVENOUS

## 2014-05-01 MED ORDER — MIDAZOLAM HCL 2 MG/2ML IJ SOLN
INTRAMUSCULAR | Status: AC
  Filled 2014-05-01: qty 2

## 2014-05-01 MED ORDER — LIDOCAINE HCL (CARDIAC) 20 MG/ML IV SOLN
INTRAVENOUS | Status: DC | PRN
  Administered 2014-05-01: 80 mg via INTRAVENOUS

## 2014-05-01 MED ORDER — PROPOFOL 10 MG/ML IV BOLUS
INTRAVENOUS | Status: AC
  Filled 2014-05-01: qty 20

## 2014-05-01 MED ORDER — IBUPROFEN 200 MG PO TABS: 600.0000 mg | ORAL_TABLET | Freq: Four times a day (QID) | ORAL | Status: DC | PRN

## 2014-05-01 MED ORDER — FENTANYL CITRATE 0.05 MG/ML IJ SOLN
25.0000 ug | INTRAMUSCULAR | Status: DC | PRN
  Administered 2014-05-01: 50 ug via INTRAVENOUS

## 2014-05-01 MED ORDER — METOCLOPRAMIDE HCL 5 MG/ML IJ SOLN: 10.0000 mg | Freq: Once | INTRAMUSCULAR | Status: DC | PRN

## 2014-05-01 MED ORDER — MEPERIDINE HCL 25 MG/ML IJ SOLN: 6.2500 mg | INTRAMUSCULAR | Status: DC | PRN

## 2014-05-01 MED ORDER — BUPIVACAINE HCL (PF) 0.25 % IJ SOLN
INTRAMUSCULAR | Status: AC
  Filled 2014-05-01: qty 30

## 2014-05-01 MED ORDER — NEOSTIGMINE METHYLSULFATE 10 MG/10ML IV SOLN
INTRAVENOUS | Status: AC
  Filled 2014-05-01: qty 1

## 2014-05-01 MED ORDER — NEOSTIGMINE METHYLSULFATE 10 MG/10ML IV SOLN
INTRAVENOUS | Status: DC | PRN
  Administered 2014-05-01: 4 mg via INTRAVENOUS

## 2014-05-01 MED ORDER — KETOROLAC TROMETHAMINE 30 MG/ML IJ SOLN
INTRAMUSCULAR | Status: DC | PRN
  Administered 2014-05-01: 30 mg via INTRAVENOUS

## 2014-05-01 MED ORDER — FENTANYL CITRATE 0.05 MG/ML IJ SOLN
INTRAMUSCULAR | Status: AC
  Filled 2014-05-01: qty 2

## 2014-05-01 MED ORDER — ROCURONIUM BROMIDE 100 MG/10ML IV SOLN
INTRAVENOUS | Status: AC
  Filled 2014-05-01: qty 1

## 2014-05-01 MED ORDER — OXYCODONE-ACETAMINOPHEN 7.5-325 MG PO TABS: 1.0000 | ORAL_TABLET | Freq: Three times a day (TID) | ORAL | Status: DC | PRN

## 2014-05-01 MED ORDER — KETOROLAC TROMETHAMINE 30 MG/ML IJ SOLN
INTRAMUSCULAR | Status: AC
  Filled 2014-05-01: qty 1

## 2014-05-01 MED ORDER — CEFAZOLIN SODIUM-DEXTROSE 2-3 GM-% IV SOLR
2.0000 g | INTRAVENOUS | Status: AC
  Administered 2014-05-01: 2 g via INTRAVENOUS

## 2014-05-01 MED ORDER — SCOPOLAMINE 1 MG/3DAYS TD PT72
MEDICATED_PATCH | TRANSDERMAL | Status: AC
  Administered 2014-05-01: 1.5 mg via TRANSDERMAL
  Filled 2014-05-01: qty 1

## 2014-05-01 MED ORDER — ROCURONIUM BROMIDE 100 MG/10ML IV SOLN
INTRAVENOUS | Status: DC | PRN
  Administered 2014-05-01: 35 mg via INTRAVENOUS
  Administered 2014-05-01: 5 mg via INTRAVENOUS
  Administered 2014-05-01: 10 mg via INTRAVENOUS

## 2014-05-01 MED ORDER — METHYLENE BLUE 1 % INJ SOLN
INTRAMUSCULAR | Status: AC
  Filled 2014-05-01: qty 1

## 2014-05-01 MED ORDER — GLYCOPYRROLATE 0.2 MG/ML IJ SOLN
INTRAMUSCULAR | Status: DC | PRN
  Administered 2014-05-01: .8 mg via INTRAVENOUS

## 2014-05-01 MED ORDER — CEFAZOLIN SODIUM-DEXTROSE 2-3 GM-% IV SOLR
INTRAVENOUS | Status: AC
  Filled 2014-05-01: qty 50

## 2014-05-01 SURGICAL SUPPLY — 39 items
BAG SPEC RTRVL LRG 6X4 10 (ENDOMECHANICALS)
BARRIER ADHS 3X4 INTERCEED (GAUZE/BANDAGES/DRESSINGS) ×2 IMPLANT
BRR ADH 4X3 ABS CNTRL BYND (GAUZE/BANDAGES/DRESSINGS) ×2
CABLE HIGH FREQUENCY MONO STRZ (ELECTRODE) ×2 IMPLANT
CATH ROBINSON RED A/P 16FR (CATHETERS) ×4 IMPLANT
CHLORAPREP W/TINT 26ML (MISCELLANEOUS) ×4 IMPLANT
CLOTH BEACON ORANGE TIMEOUT ST (SAFETY) ×4 IMPLANT
DRSG COVADERM PLUS 2X2 (GAUZE/BANDAGES/DRESSINGS) ×6 IMPLANT
DRSG OPSITE POSTOP 3X4 (GAUZE/BANDAGES/DRESSINGS) ×2 IMPLANT
ELECT REM PT RETURN 9FT ADLT (ELECTROSURGICAL) ×4
ELECTRODE REM PT RTRN 9FT ADLT (ELECTROSURGICAL) IMPLANT
EVACUATOR SMOKE 8.L (FILTER) ×2 IMPLANT
FORCEPS CUTTING 33CM 5MM (CUTTING FORCEPS) IMPLANT
FORCEPS CUTTING 45CM 5MM (CUTTING FORCEPS) IMPLANT
GLOVE BIO SURGEON STRL SZ7 (GLOVE) ×6 IMPLANT
GLOVE BIOGEL PI IND STRL 7.0 (GLOVE) ×2 IMPLANT
GLOVE BIOGEL PI INDICATOR 7.0 (GLOVE) ×2
GOWN STRL REUS W/TWL LRG LVL3 (GOWN DISPOSABLE) ×12 IMPLANT
LIQUID BAND (GAUZE/BANDAGES/DRESSINGS) ×4 IMPLANT
MANIPULATOR UTERINE 4.5 ZUMI (MISCELLANEOUS) ×4 IMPLANT
NEEDLE INSUFFLATION 120MM (ENDOMECHANICALS) ×4 IMPLANT
NS IRRIG 1000ML POUR BTL (IV SOLUTION) ×4 IMPLANT
PACK LAPAROSCOPY BASIN (CUSTOM PROCEDURE TRAY) ×4 IMPLANT
PAD POSITIONER PINK NONSTERILE (MISCELLANEOUS) ×4 IMPLANT
PENCIL BUTTON HOLSTER BLD 10FT (ELECTRODE) ×2 IMPLANT
POUCH SPECIMEN RETRIEVAL 10MM (ENDOMECHANICALS) IMPLANT
PROTECTOR NERVE ULNAR (MISCELLANEOUS) ×2 IMPLANT
SCISSORS LAP 5X35 DISP (ENDOMECHANICALS) IMPLANT
SET IRRIG TUBING LAPAROSCOPIC (IRRIGATION / IRRIGATOR) IMPLANT
SOLUTION ELECTROLUBE (MISCELLANEOUS) IMPLANT
SUT VICRYL 0 UR6 27IN ABS (SUTURE) ×4 IMPLANT
SUT VICRYL 4-0 PS2 18IN ABS (SUTURE) ×4 IMPLANT
TOWEL OR 17X24 6PK STRL BLUE (TOWEL DISPOSABLE) ×8 IMPLANT
TRAY FOLEY CATH 14FR (SET/KITS/TRAYS/PACK) ×2 IMPLANT
TROCAR BALLN 12MMX100 BLUNT (TROCAR) ×2 IMPLANT
TROCAR XCEL NON-BLD 11X100MML (ENDOMECHANICALS) IMPLANT
TROCAR XCEL NON-BLD 5MMX100MML (ENDOMECHANICALS) ×6 IMPLANT
WARMER LAPAROSCOPE (MISCELLANEOUS) ×4 IMPLANT
WATER STERILE IRR 1000ML POUR (IV SOLUTION) ×2 IMPLANT

## 2014-05-01 NOTE — Anesthesia Preprocedure Evaluation (Addendum)
Anesthesia Evaluation  Patient identified by MRN, date of birth, ID band Patient awake    Reviewed: Allergy & Precautions, NPO status , Patient's Chart, lab work & pertinent test results  Airway Mallampati: III  TM Distance: >3 FB Neck ROM: Full    Dental no notable dental hx. (+) Teeth Intact   Pulmonary former smoker,  breath sounds clear to auscultation  Pulmonary exam normal       Cardiovascular hypertension, Rhythm:Regular Rate:Normal     Neuro/Psych  Headaches, PSYCHIATRIC DISORDERS Depression    GI/Hepatic Neg liver ROS, GERD-  Medicated and Controlled,  Endo/Other  Obesity  Renal/GU negative Renal ROS  negative genitourinary   Musculoskeletal negative musculoskeletal ROS (+)   Abdominal (+) + obese,   Peds  Hematology  (+) anemia ,   Anesthesia Other Findings   Reproductive/Obstetrics Pelvic Pain                            Anesthesia Physical Anesthesia Plan  ASA: II  Anesthesia Plan: General   Post-op Pain Management:    Induction: Intravenous and Cricoid pressure planned  Airway Management Planned: Oral ETT  Additional Equipment:   Intra-op Plan:   Post-operative Plan: Extubation in OR  Informed Consent: I have reviewed the patients History and Physical, chart, labs and discussed the procedure including the risks, benefits and alternatives for the proposed anesthesia with the patient or authorized representative who has indicated his/her understanding and acceptance.   Dental advisory given  Plan Discussed with: CRNA, Anesthesiologist and Surgeon  Anesthesia Plan Comments:         Anesthesia Quick Evaluation

## 2014-05-01 NOTE — Transfer of Care (Signed)
Immediate Anesthesia Transfer of Care Note  Patient: Karen Holder  Procedure(s) Performed: Procedure(s): LAPAROSCOPY DIAGNOSTIC (N/A) LAPAROSCOPIC LYSIS OF ADHESIONS LAPAROSCOPY OPERATIVE  Patient Location: PACU  Anesthesia Type:General  Level of Consciousness: awake, alert  and oriented  Airway & Oxygen Therapy: Patient Spontanous Breathing and Patient connected to nasal cannula oxygen  Post-op Assessment: Report given to RN, Post -op Vital signs reviewed and stable and Patient moving all extremities  Post vital signs: Reviewed and stable  Last Vitals: There were no vitals filed for this visit.  Complications: No apparent anesthesia complications

## 2014-05-01 NOTE — Discharge Instructions (Signed)

## 2014-05-01 NOTE — Anesthesia Postprocedure Evaluation (Signed)
  Anesthesia Post-op Note  Patient: Karen Holder  Procedure(s) Performed: Procedure(s): LAPAROSCOPY DIAGNOSTIC (N/A) LAPAROSCOPIC LYSIS OF ADHESIONS LAPAROSCOPY OPERATIVE  Patient Location: PACU  Anesthesia Type:General  Level of Consciousness: awake, alert  and oriented  Airway and Oxygen Therapy: Patient Spontanous Breathing  Post-op Pain: none  Post-op Assessment: Post-op Vital signs reviewed, Patient's Cardiovascular Status Stable, Respiratory Function Stable, Patent Airway, No signs of Nausea or vomiting and Pain level controlled  Post-op Vital Signs: Reviewed and stable  Last Vitals:  Filed Vitals:   05/01/14 1415  BP:   Pulse: 75  Temp: 36.9 C  Resp: 14    Complications: No apparent anesthesia complications

## 2014-05-01 NOTE — Op Note (Signed)
Procedure Note :  Operative Laparoscopy, lysis of pelvic adhesions, fulguration of endometriosis.    Indications: The patient is a 34 y.o. female with pelvic pain, dysmenorrhea and dyspareunia. Pain better when on birth control pills, stopped since trying to conceive and pain has progressively worsened.   Pre-operative Diagnosis: Pelvic pain, dyspareunia, dysmenorrhea, suspect endometriosis  Post-operative Diagnosis: Same  Surgeon: Nolton Denis R   Assistants: None  Anesthesia: General endotracheal  Procedure Details  The patient was seen in the Holding Room. The risks, benefits, complications, treatment options, and expected outcomes were discussed with the patient. The possibilities of reaction to medication, pulmonary aspiration, perforation of viscus, bleeding, recurrent infection, the need for additional procedures, failure to diagnose a condition, and creating a complication requiring transfusion or operation were discussed with the patient. The patient concurred with the proposed plan, giving informed consent. The patient was taken to the Operating Room, identified as Karen Holder and the procedure verified as Diagnostic Laparoscopy. A Time Out was held and the above information confirmed.  After induction of general anesthesia, the patient was placed in modified dorsal lithotomy position where she was prepped, draped, and catheterized in the normal, sterile fashion. Foley was placed. Cervix was visualized and an intrauterine manipulator was placed. A 10 mm umbilical incision was then performed. Fascia grasped and cut and peritoneal entry made. Hassan canula was placed and balloon inflated to secure for pneumoperitoneum. Pneumoperitoneum was established. Laparoscope introduced.  Findings: Minimal pelvic endometriosis with few areas of small dark spots in right utero-sacral area, red blebby lesions on left posterior wall of the uterus, filmy adhesions of sigmoid colon to lateral  pelvic wall on the left, normal tubes and ovaries with no adhesions or lesions. Liver, appendix normal.   Left lower port placed after placing 5 mm trocar under vision.  Adhesiolysis of left colon adhesions performed. Endometriotic implants on posterior left uterine wall fulgurated with endoscissors with monopolar cautery. Right pelvic filmy adhesions excised. Endometriosis on uterosacral ligaments fulgurated.   Following the procedure, left port removed. Umbilical sheath was removed after intra-abdominal carbon dioxide was expressed. The fascial incision was closed with 0-vicryl. Skin closed with subcuticular sutures of 4-0 Vicryl. The intrauterine manipulator and foley catheter were then removed.  Instrument, sponge, and needle counts were correct at the conclusion of the case. Patient tolerated procedure well.   Estimated Blood Loss:  minimal  Total IV Fluids:  LR 900 cc          Specimens: none              Complications:  None; patient tolerated the procedure well.         Disposition: PACU - hemodynamically stable.         Condition: stable

## 2014-05-01 NOTE — Anesthesia Procedure Notes (Signed)
Procedure Name: Intubation Date/Time: 05/01/2014 11:25 AM Performed by: Donnalee CurryMALINOVA, Keimani Laufer HRISTOVA Pre-anesthesia Checklist: Patient identified, Emergency Drugs available, Suction available and Patient being monitored Patient Re-evaluated:Patient Re-evaluated prior to inductionOxygen Delivery Method: Circle system utilized Preoxygenation: Pre-oxygenation with 100% oxygen Intubation Type: IV induction and Cricoid Pressure applied Ventilation: Two handed mask ventilation required Tube size: 7.0 mm Number of attempts: 1 Airway Equipment and Method: Stylet Placement Confirmation: ETT inserted through vocal cords under direct vision,  positive ETCO2 and breath sounds checked- equal and bilateral Secured at: 21 cm Tube secured with: Tape Dental Injury: Teeth and Oropharynx as per pre-operative assessment

## 2014-05-04 ENCOUNTER — Encounter (HOSPITAL_COMMUNITY): Payer: Self-pay | Admitting: Obstetrics & Gynecology

## 2014-07-15 ENCOUNTER — Other Ambulatory Visit: Payer: Self-pay | Admitting: Family Medicine

## 2014-08-25 ENCOUNTER — Other Ambulatory Visit: Payer: Self-pay | Admitting: Physician Assistant

## 2014-09-21 ENCOUNTER — Ambulatory Visit (INDEPENDENT_AMBULATORY_CARE_PROVIDER_SITE_OTHER): Payer: PRIVATE HEALTH INSURANCE

## 2014-09-21 ENCOUNTER — Ambulatory Visit (INDEPENDENT_AMBULATORY_CARE_PROVIDER_SITE_OTHER): Payer: PRIVATE HEALTH INSURANCE | Admitting: Family Medicine

## 2014-09-21 VITALS — BP 120/80 | HR 105 | Temp 99.3°F | Resp 16 | Ht 66.0 in | Wt 199.4 lb

## 2014-09-21 DIAGNOSIS — R197 Diarrhea, unspecified: Secondary | ICD-10-CM

## 2014-09-21 DIAGNOSIS — K529 Noninfective gastroenteritis and colitis, unspecified: Secondary | ICD-10-CM

## 2014-09-21 LAB — HEMOCCULT GUIAC POC 1CARD (OFFICE): Fecal Occult Blood, POC: NEGATIVE

## 2014-09-21 LAB — POCT CBC
GRANULOCYTE PERCENT: 55.3 % (ref 37–80)
HCT, POC: 42.6 % (ref 37.7–47.9)
Hemoglobin: 14.3 g/dL (ref 12.2–16.2)
LYMPH, POC: 3.7 — AB (ref 0.6–3.4)
MCH, POC: 29.8 pg (ref 27–31.2)
MCHC: 33.6 g/dL (ref 31.8–35.4)
MCV: 88.7 fL (ref 80–97)
MID (CBC): 0.4 (ref 0–0.9)
MPV: 7.1 fL (ref 0–99.8)
POC GRANULOCYTE: 5.1 (ref 2–6.9)
POC LYMPH %: 40.7 % (ref 10–50)
POC MID %: 4 %M (ref 0–12)
Platelet Count, POC: 303 10*3/uL (ref 142–424)
RBC: 4.8 M/uL (ref 4.04–5.48)
RDW, POC: 12.6 %
WBC: 9.2 10*3/uL (ref 4.6–10.2)

## 2014-09-21 LAB — POCT URINALYSIS DIPSTICK
Bilirubin, UA: NEGATIVE
Blood, UA: NEGATIVE
GLUCOSE UA: NEGATIVE
KETONES UA: NEGATIVE
LEUKOCYTES UA: NEGATIVE
Nitrite, UA: NEGATIVE
PH UA: 7
Protein, UA: NEGATIVE
Spec Grav, UA: 1.01
UROBILINOGEN UA: 0.2

## 2014-09-21 MED ORDER — SUMATRIPTAN 20 MG/ACT NA SOLN: NASAL | Status: AC

## 2014-09-21 MED ORDER — CIPROFLOXACIN HCL 500 MG PO TABS: 500.0000 mg | ORAL_TABLET | Freq: Two times a day (BID) | ORAL | Status: DC

## 2014-09-21 MED ORDER — PROMETHAZINE HCL 25 MG PO TABS: 25.0000 mg | ORAL_TABLET | Freq: Four times a day (QID) | ORAL | Status: DC | PRN

## 2014-09-21 NOTE — Progress Notes (Signed)
Subjective:    Patient ID: Karen Holder, female    DOB: Feb 08, 1981, 34 y.o.   MRN: [id number] Chief Complaint  Patient presents with  . Diarrhea    x 4 days   . Abdominal Pain  . Back Pain    HPI  Sxs started Thursday night starting with diarrhea which has persistent.  Whenever she eats it is followed by large volume watery diarrhea almost immed.  She has been able to keep eating and drinking but has been having fecal incontinence even.  She has been having a few high carb meals in small volume.  Works as a Diplomatic Services operational officer at [employer] but no known sick contacts.  2 yo son w/lo sxs - does go to daycare.  Has had some chills followed by sweats.  Nauseated but no emesis.  Has been taking some tums and used a few phenergan.  No anti-diarrheal meds tried. No travels, foods, pets, recent anbtitiobic  No UTIs  + dizzy and HAs., dry mouth  Had a laparoscopy earlier this year for lysis of adhesions from endometriosis but no other abd/pelvic surgeries.  Past Medical History  Diagnosis Date  . Depression   . Allergy   . Migraines     onset age 89; previous [facility] consultation.  Previous trial fo Topamax, Amitriptyline.  . Hypertension 2014    PIH  . GERD (gastroesophageal reflux disease)   . Chronic pelvic pain in female 05/01/2014   Past Surgical History  Procedure Laterality Date  . Mole removal    . No past surgeries    . Laparoscopy N/A 05/01/2014    Procedure: LAPAROSCOPY DIAGNOSTIC;  Surgeon: Shea Evans, MD;  Location: [facility];  Service: Gynecology;  Laterality: N/A;  . Laparoscopic lysis of adhesions  05/01/2014    Procedure: LAPAROSCOPIC LYSIS OF ADHESIONS;  Surgeon: Shea Evans, MD;  Location: [facility];  Service: Gynecology;;  . Laparoscopy  05/01/2014    Procedure: LAPAROSCOPY OPERATIVE;  Surgeon: Shea Evans, MD;  Location: [facility];  Service: Gynecology;;   Current Outpatient Prescriptions on File Prior to Visit  Medication Sig Dispense Refill  .  amphetamine-dextroamphetamine (ADDERALL) 20 MG tablet Take 20 mg by mouth 2 (two) times daily.    . Aspirin-Salicylamide-Caffeine (BC HEADACHE POWDER PO) Take 1 packet by mouth 2 (two) times daily as needed (For pain.).    Marland Kitchen butalbital-acetaminophen-caffeine (FIORICET, ESGIC) 50-325-40 MG per tablet Take 1-2 tablets by mouth every 6 (six) hours as needed for headache.    . calcium carbonate (TUMS - DOSED IN MG ELEMENTAL CALCIUM) 500 MG chewable tablet Chew 2 tablets by mouth 3 (three) times daily as needed for indigestion or heartburn. heartburn    . diphenhydrAMINE (BENADRYL) 25 MG tablet Take 25 mg by mouth 2 (two) times daily as needed (For sinus issues.).    Marland Kitchen EPINEPHrine (EPIPEN) 0.3 mg/0.3 mL SOAJ injection Inject 0.3 mLs (0.3 mg total) into the muscle once. 2 Device 3  . iron polysaccharides (NIFEREX) 150 MG capsule Take 1 capsule (150 mg total) by mouth daily. 30 capsule 0  . magnesium gluconate (MAGONATE) 500 MG tablet Take 1,500 mg by mouth daily.     . Omega-3 Fatty Acids (FISH OIL) 1000 MG CAPS Take 1 capsule by mouth daily.     . Prenatal Vit-Fe Fumarate-FA (PRENATAL MULTIVITAMIN) TABS Take 1 tablet by mouth daily.    . SUMAtriptan (IMITREX) 20 MG/ACT nasal spray PLACE 1 SPRAY INTO THE NOSE EVERY 2 HOURS AS NEEDED FOR MIGRAINE,  MAY REPEAT IN 2 HRS IF NEEDED.  "OV NEEDED FOR FURTHER REFILLS" 1 Inhaler 0  . venlafaxine (EFFEXOR) 75 MG tablet Take 75 mg by mouth 2 (two) times daily.    . clorazepate (TRANXENE) 15 MG tablet Take 15 mg by mouth 3 (three) times daily as needed for anxiety.    Marland Kitchen ibuprofen (MOTRIN IB) 200 MG tablet Take 3 tablets (600 mg total) by mouth every 6 (six) hours as needed. (Patient not taking: Reported on 09/21/2014) 30 tablet 0  . oxyCODONE-acetaminophen (PERCOCET) 7.5-325 MG per tablet Take 1 tablet by mouth every 8 (eight) hours as needed for pain. (Patient not taking: Reported on 09/21/2014) 30 tablet 0  . oxyCODONE-acetaminophen (PERCOCET/ROXICET) 5-325 MG per  tablet Take 1 tablet by mouth every 6 (six) hours as needed for severe pain. (Patient not taking: Reported on 09/21/2014) 30 tablet 0   No current facility-administered medications on file prior to visit.   Allergies  Allergen Reactions  . Other Swelling    Bee stings   . Eggs Or Egg-Derived Products Diarrhea and Nausea And Vomiting   Family History  Problem Relation Age of Onset  . Other Neg Hx   . Hypertension Father   . Hyperlipidemia Father    History   Social History  . Marital Status: Married    Spouse Name: N/A  . Number of Children: 1  . Years of Education: N/A   Occupational History  .      Diplomatic Services operational officer at [employer]   Social History Main Topics  . Smoking status: Former Games developer  . Smokeless tobacco: Not on file  . Alcohol Use: No  . Drug Use: No  . Sexual Activity: Yes   Other Topics Concern  . 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      Tobacco: none      Alcohol: none      Drugs:  none   Depression screen PHQ 2/9 09/21/2014  Decreased Interest 0  Down, Depressed, Hopeless 1  PHQ - 2 Score 1     . Review of Systems  Constitutional: Positive for fever, chills, diaphoresis, activity change, appetite change and fatigue. Negative for unexpected weight change.  Respiratory: Negative for shortness of breath.   Cardiovascular: Negative for chest pain and leg swelling.  Gastrointestinal: Positive for nausea, abdominal pain, diarrhea, abdominal distention and rectal pain. Negative for vomiting, constipation, blood in stool and anal bleeding.  Endocrine: Positive for polydipsia.  Genitourinary: Negative for dysuria, urgency, decreased urine volume and difficulty urinating.  Musculoskeletal: Positive for back pain. Negative for gait problem.  Skin: Negative for rash.  Allergic/Immunologic: Negative for immunocompromised state.    Neurological: Positive for dizziness, weakness, light-headedness and headaches. Negative for syncope.  Hematological: Negative for adenopathy.  Psychiatric/Behavioral: Positive for sleep disturbance.       Objective:  BP 120/80 mmHg  Pulse 105  Temp(Src) 99.3 F (37.4 C) (Oral)  Resp 16  Ht 5\' 6"  (1.676 m)  Wt 199 lb 6.4 oz (90.447 kg)  BMI 32.20 kg/m2  SpO2 98%  LMP 09/14/2014  Physical Exam  Constitutional: She is oriented to person, place, and time. She appears well-developed and well-nourished. No distress.  HENT:  Head: Normocephalic and atraumatic.  Neck: Normal range of motion. Neck supple. No thyromegaly present.  Cardiovascular: Normal rate, regular  rhythm, normal heart sounds and intact distal pulses.   Pulmonary/Chest: Effort normal and breath sounds normal. No respiratory distress.  Abdominal: Soft. Bowel sounds are increased. There is no hepatosplenomegaly. There is generalized tenderness. There is no rebound, no guarding, no CVA tenderness, no tenderness at McBurney's point and negative Murphy's sign. No hernia.  Musculoskeletal: She exhibits no edema.  Lymphadenopathy:    She has no cervical adenopathy.  Neurological: She is alert and oriented to person, place, and time.  Skin: Skin is warm and dry. She is not diaphoretic. No erythema.  Psychiatric: She has a normal mood and affect. Her behavior is normal.      + orthostatics [facility] reading (PRIMARY) by  Dr. Clelia Croft. Acute abd series: No acute abnormality, no free air, no sig signs of obstruction Assessment & Plan:   1. Diarrhea   2. Gastroenteritis, acute - suspect viral but as sig sxs x 4d as well as very mild leukocytosis will start cipro after stool studies are collected  3.      Dehydration - 2L NS IVF today in office with sig sxs improvement 4.      Migraines - imitrex refilled - works well and only needs occasionally.  Pt comes to [facility] for her primary care.  Orders Placed This Encounter  Procedures  .  Clostridium Difficile by PCR (not at [facility])    Order Specific Question:  Is your patient experiencing loose or watery stools (3 or more in 24 hours)?    Answer:  Yes    Order Specific Question:  Has the patient received laxatives in the last 24 hours?    Answer:  No    Order Specific Question:  Has a negative Cdiff test resulted in the last 7 days?    Answer:  No  . Stool culture  . DG Abd Acute W/Chest    Order Specific Question:  Reason for exam:    Answer:  abd distended, typmantiic bowel sounds, h/o surgery for abdominal adhesions    Order Specific Question:  Is the patient pregnant?    Answer:  No    Order Specific Question:  Preferred imaging location?    Answer:  [facility]  . Comprehensive metabolic panel  . Fecal lactoferrin  . POCT CBC  . POCT urinalysis dipstick  . Hemoccult - 1 Card (office)    Meds ordered this encounter  Medications  . levocetirizine (XYZAL) 5 MG tablet    Sig: Take 5 mg by mouth every evening.  . Beclomethasone Dipropionate (QNASL) 80 MCG/ACT AERS    Sig: Place 80 mg into the nose 2 (two) times daily.  . SUMAtriptan (IMITREX) 20 MG/ACT nasal spray    Sig: PLACE 1 SPRAY INTO THE NOSE EVERY 2 HOURS AS NEEDED FOR MIGRAINE, MAY REPEAT IN 2 HRS IF NEEDED.    Dispense:  1 Inhaler    Refill:  2  . promethazine (PHENERGAN) 25 MG tablet    Sig: Take 1 tablet (25 mg total) by mouth every 6 (six) hours as needed for nausea or vomiting.    Dispense:  40 tablet    Refill:  0  . ciprofloxacin (CIPRO) 500 MG tablet    Sig: Take 1 tablet (500 mg total) by mouth 2 (two) times daily.    Dispense:  10 tablet    Refill:  0    Norberto Sorenson, MD MPH  Results for orders placed or performed in visit on 09/21/14  Clostridium Difficile by PCR (not at [facility])  Result Value Ref Range   C difficile by pcr Not Detected Not Detected  Stool culture  Result Value Ref Range   Preliminary Report No Suspicious Colonies, Continuing to Hold   Comprehensive metabolic panel    Result Value Ref Range   Sodium 143 135 - 146 mEq/L   Potassium 4.3 3.5 - 5.3 mEq/L   Chloride 102 98 - 110 mEq/L   CO2 30 20 - 31 mEq/L   Glucose, Bld 128 (H) 65 - 99 mg/dL   BUN 11 7 - 25 mg/dL   Creat 1.61 0.96 - 0.45 mg/dL   Total Bilirubin 0.4 0.2 - 1.2 mg/dL   Alkaline Phosphatase 68 33 - 115 U/L   AST 23 10 - 30 U/L   ALT 37 (H) 6 - 29 U/L   Total Protein 7.3 6.1 - 8.1 g/dL   Albumin 4.4 3.6 - 5.1 g/dL   Calcium 40.9 8.6 - 81.1 mg/dL  Fecal lactoferrin  Result Value Ref Range   Lactoferrin NEGATIVE   POCT CBC  Result Value Ref Range   WBC 9.2 4.6 - 10.2 K/uL   Lymph, poc 3.7 (A) 0.6 - 3.4   POC LYMPH PERCENT 40.7 10 - 50 %L   MID (cbc) 0.4 0 - 0.9   POC MID % 4.0 0 - 12 %M   POC Granulocyte 5.1 2 - 6.9   Granulocyte percent 55.3 37 - 80 %G   RBC 4.80 4.04 - 5.48 M/uL   Hemoglobin 14.3 12.2 - 16.2 g/dL   HCT, POC 91.4 78.2 - 47.9 %   MCV 88.7 80 - 97 fL   MCH, POC 29.8 27 - 31.2 pg   MCHC 33.6 31.8 - 35.4 g/dL   RDW, POC 95.6 %   Platelet Count, POC 303 142 - 424 K/uL   MPV 7.1 0 - 99.8 fL  POCT urinalysis dipstick  Result Value Ref Range   Color, UA yellow    Clarity, UA clear    Glucose, UA neg    Bilirubin, UA neg    Ketones, UA neg    Spec Grav, UA 1.010    Blood, UA neg    pH, UA 7.0    Protein, UA neg    Urobilinogen, UA 0.2    Nitrite, UA neg    Leukocytes, UA Negative Negative  Hemoccult - 1 Card (office)  Result Value Ref Range   Fecal Occult Blood, POC Negative Negative   Card #1 Date 09/21/14    Card #2 Fecal Occult Blod, POC     Card #2 Date     Card #3 Fecal Occult Blood, POC     Card #3 Date

## 2014-09-21 NOTE — Patient Instructions (Signed)
Food Choices to Help Relieve Diarrhea °When you have diarrhea, the foods you eat and your eating habits are very important. Choosing the right foods and drinks can help relieve diarrhea. Also, because diarrhea can last up to 7 days, you need to replace lost fluids and electrolytes (such as sodium, potassium, and chloride) in order to help prevent dehydration.  °WHAT GENERAL GUIDELINES DO I NEED TO FOLLOW? °· Slowly drink 1 cup (8 oz) of fluid for each episode of diarrhea. If you are getting enough fluid, your urine will be clear or pale yellow. °· Eat starchy foods. Some good choices include white rice, white toast, pasta, low-fiber cereal, baked potatoes (without the skin), saltine crackers, and bagels. °· Avoid large servings of any cooked vegetables. °· Limit fruit to two servings per day. A serving is ½ cup or 1 small piece. °· Choose foods with less than 2 g of fiber per serving. °· Limit fats to less than 8 tsp (38 g) per day. °· Avoid fried foods. °· Eat foods that have probiotics in them. Probiotics can be found in certain dairy products. °· Avoid foods and beverages that may increase the speed at which food moves through the stomach and intestines (gastrointestinal tract). Things to avoid include: °¨ High-fiber foods, such as dried fruit, raw fruits and vegetables, nuts, seeds, and whole grain foods. °¨ Spicy foods and high-fat foods. °¨ Foods and beverages sweetened with high-fructose corn syrup, honey, or sugar alcohols such as xylitol, sorbitol, and mannitol. °WHAT FOODS ARE RECOMMENDED? °Grains °White rice. White, French, or pita breads (fresh or toasted), including plain rolls, buns, or bagels. White pasta. Saltine, soda, or graham crackers. Pretzels. Low-fiber cereal. Cooked cereals made with water (such as cornmeal, farina, or cream cereals). Plain muffins. Matzo. Melba toast. Zwieback.  °Vegetables °Potatoes (without the skin). Strained tomato and vegetable juices. Most well-cooked and canned  vegetables without seeds. Tender lettuce. °Fruits °Cooked or canned applesauce, apricots, cherries, fruit cocktail, grapefruit, peaches, pears, or plums. Fresh bananas, apples without skin, cherries, grapes, cantaloupe, grapefruit, peaches, oranges, or plums.  °Meat and Other Protein Products °Baked or boiled chicken. Eggs. Tofu. Fish. Seafood. Smooth peanut butter. Ground or well-cooked tender beef, ham, veal, lamb, pork, or poultry.  °Dairy °Plain yogurt, kefir, and unsweetened liquid yogurt. Lactose-free milk, buttermilk, or soy milk. Plain hard cheese. °Beverages °Sport drinks. Clear broths. Diluted fruit juices (except prune). Regular, caffeine-free sodas such as ginger ale. Water. Decaffeinated teas. Oral rehydration solutions. Sugar-free beverages not sweetened with sugar alcohols. °Other °Bouillon, broth, or soups made from recommended foods.  °The items listed above may not be a complete list of recommended foods or beverages. Contact your dietitian for more options. °WHAT FOODS ARE NOT RECOMMENDED? °Grains °Whole grain, whole wheat, bran, or rye breads, rolls, pastas, crackers, and cereals. Wild or brown rice. Cereals that contain more than 2 g of fiber per serving. Corn tortillas or taco shells. Cooked or dry oatmeal. Granola. Popcorn. °Vegetables °Raw vegetables. Cabbage, broccoli, Brussels sprouts, artichokes, baked beans, beet greens, corn, kale, legumes, peas, sweet potatoes, and yams. Potato skins. Cooked spinach and cabbage. °Fruits °Dried fruit, including raisins and dates. Raw fruits. Stewed or dried prunes. Fresh apples with skin, apricots, mangoes, pears, raspberries, and strawberries.  °Meat and Other Protein Products °Chunky peanut butter. Nuts and seeds. Beans and lentils. Bacon.  °Dairy °High-fat cheeses. Milk, chocolate milk, and beverages made with milk, such as milk shakes. Cream. Ice cream. °Sweets and Desserts °Sweet rolls, doughnuts, and sweet breads. Pancakes   and waffles. °Fats and  Oils °Butter. Cream sauces. Margarine. Salad oils. Plain salad dressings. Olives. Avocados.  °Beverages °Caffeinated beverages (such as coffee, tea, soda, or energy drinks). Alcoholic beverages. Fruit juices with pulp. Prune juice. Soft drinks sweetened with high-fructose corn syrup or sugar alcohols. °Other °Coconut. Hot sauce. Chili powder. Mayonnaise. Gravy. Cream-based or milk-based soups.  °The items listed above may not be a complete list of foods and beverages to avoid. Contact your dietitian for more information. °WHAT SHOULD I DO IF I BECOME DEHYDRATED? °Diarrhea can sometimes lead to dehydration. Signs of dehydration include dark urine and dry mouth and skin. If you think you are dehydrated, you should rehydrate with an oral rehydration solution. These solutions can be purchased at pharmacies, retail stores, or online.  °Drink ½-1 cup (120-240 mL) of oral rehydration solution each time you have an episode of diarrhea. If drinking this amount makes your diarrhea worse, try drinking smaller amounts more often. For example, drink 1-3 tsp (5-15 mL) every 5-10 minutes.  °A general rule for staying hydrated is to drink 1½-2 L of fluid per day. Talk to your health care provider about the specific amount you should be drinking each day. Drink enough fluids to keep your urine clear or pale yellow. °Document Released: 05/06/2003 Document Revised: 02/18/2013 Document Reviewed: 01/06/2013 °ExitCare® Patient Information ©2015 [employer]. This information is not intended to replace advice given to you by your health care provider. Make sure you discuss any questions you have with your health care provider. °Viral Gastroenteritis °Viral gastroenteritis is also known as stomach flu. This condition affects the stomach and intestinal tract. It can cause sudden diarrhea and vomiting. The illness typically lasts 3 to 8 days. Most people develop an immune response that eventually gets rid of the virus. While this natural  response develops, the virus can make you quite ill. °CAUSES  °Many different viruses can cause gastroenteritis, such as rotavirus or noroviruses. You can catch one of these viruses by consuming contaminated food or water. You may also catch a virus by sharing utensils or other personal items with an infected person or by touching a contaminated surface. °SYMPTOMS  °The most common symptoms are diarrhea and vomiting. These problems can cause a severe loss of body fluids (dehydration) and a body salt (electrolyte) imbalance. Other symptoms may include: °· Fever. °· Headache. °· Fatigue. °· Abdominal pain. °DIAGNOSIS  °Your caregiver can usually diagnose viral gastroenteritis based on your symptoms and a physical exam. A stool sample may also be taken to test for the presence of viruses or other infections. °TREATMENT  °This illness typically goes away on its own. Treatments are aimed at rehydration. The most serious cases of viral gastroenteritis involve vomiting so severely that you are not able to keep fluids down. In these cases, fluids must be given through an intravenous line (IV). °HOME CARE INSTRUCTIONS  °· Drink enough fluids to keep your urine clear or pale yellow. Drink small amounts of fluids frequently and increase the amounts as tolerated. °· Ask your caregiver for specific rehydration instructions. °· Avoid: °¨ Foods high in sugar. °¨ Alcohol. °¨ Carbonated drinks. °¨ Tobacco. °¨ Juice. °¨ Caffeine drinks. °¨ Extremely hot or cold fluids. °¨ Fatty, greasy foods. °¨ Too much intake of anything at one time. °¨ Dairy products until 24 to 48 hours after diarrhea stops. °· You may consume probiotics. Probiotics are active cultures of beneficial bacteria. They may lessen the amount and number of diarrheal stools in adults. Probiotics   can be found in yogurt with active cultures and in supplements. °· Wash your hands well to avoid spreading the virus. °· Only take over-the-counter or prescription medicines for  pain, discomfort, or fever as directed by your caregiver. Do not give aspirin to children. Antidiarrheal medicines are not recommended. °· Ask your caregiver if you should continue to take your regular prescribed and over-the-counter medicines. °· Keep all follow-up appointments as directed by your caregiver. °SEEK IMMEDIATE MEDICAL CARE IF:  °· You are unable to keep fluids down. °· You do not urinate at least once every 6 to 8 hours. °· You develop shortness of breath. °· You notice blood in your stool or vomit. This may look like coffee grounds. °· You have abdominal pain that increases or is concentrated in one small area (localized). °· You have persistent vomiting or diarrhea. °· You have a fever. °· The patient is a child younger than 3 months, and he or she has a fever. °· The patient is a child older than 3 months, and he or she has a fever and persistent symptoms. °· The patient is a child older than 3 months, and he or she has a fever and symptoms suddenly get worse. °· The patient is a baby, and he or she has no tears when crying. °MAKE SURE YOU:  °· Understand these instructions. °· Will watch your condition. °· Will get help right away if you are not doing well or get worse. °Document Released: 02/13/2005 Document Revised: 05/08/2011 Document Reviewed: 11/30/2010 °ExitCare® Patient Information ©2015 [employer]. This information is not intended to replace advice given to you by your health care provider. Make sure you discuss any questions you have with your health care provider. ° °

## 2014-09-22 LAB — COMPREHENSIVE METABOLIC PANEL
ALK PHOS: 68 U/L (ref 33–115)
ALT: 37 U/L — ABNORMAL HIGH (ref 6–29)
AST: 23 U/L (ref 10–30)
Albumin: 4.4 g/dL (ref 3.6–5.1)
BUN: 11 mg/dL (ref 7–25)
CO2: 30 mEq/L (ref 20–31)
Calcium: 10.2 mg/dL (ref 8.6–10.2)
Chloride: 102 mEq/L (ref 98–110)
Creat: 0.97 mg/dL (ref 0.50–1.10)
Glucose, Bld: 128 mg/dL — ABNORMAL HIGH (ref 65–99)
Potassium: 4.3 mEq/L (ref 3.5–5.3)
Sodium: 143 mEq/L (ref 135–146)
TOTAL PROTEIN: 7.3 g/dL (ref 6.1–8.1)
Total Bilirubin: 0.4 mg/dL (ref 0.2–1.2)

## 2014-09-23 ENCOUNTER — Telehealth: Payer: Self-pay

## 2014-09-23 LAB — CLOSTRIDIUM DIFFICILE BY PCR: Toxigenic C. Difficile by PCR: NOT DETECTED

## 2014-09-23 LAB — FECAL LACTOFERRIN, QUANT: Lactoferrin: NEGATIVE

## 2014-09-23 NOTE — Telephone Encounter (Signed)
Pt called about stool tests. Let her know her CDiff was neg but the stool cx was still pending.

## 2014-09-23 NOTE — Telephone Encounter (Signed)
Thank you :)

## 2014-09-26 LAB — STOOL CULTURE

## 2014-09-30 ENCOUNTER — Telehealth: Payer: Self-pay

## 2014-09-30 MED ORDER — DIPHENOXYLATE-ATROPINE 2.5-0.025 MG PO TABS: 1.0000 | ORAL_TABLET | Freq: Four times a day (QID) | ORAL | Status: DC | PRN

## 2014-09-30 NOTE — Telephone Encounter (Signed)
Called pt. She is still having numerous episodes of watery diarrhea daily.  While she was on the 5d of cipro and maybe had a little improvement but not much.  She has been using otc anti-diarrheals w/o sig benefit.  Is not having any abdominal pain or cramping.  No vomiting, occ rare nausea.

## 2014-09-30 NOTE — Telephone Encounter (Signed)
Patient is still experiencing terrible stomach pains and going to the bathroom constantly. Please call her with what she needs to do. Can she have some meds called in or does she need to come into be seen again.

## 2014-10-09 ENCOUNTER — Other Ambulatory Visit: Payer: Self-pay | Admitting: Physician Assistant

## 2014-10-09 ENCOUNTER — Ambulatory Visit (INDEPENDENT_AMBULATORY_CARE_PROVIDER_SITE_OTHER): Payer: PRIVATE HEALTH INSURANCE | Admitting: Physician Assistant

## 2014-10-09 VITALS — BP 98/60 | HR 110 | Temp 99.6°F | Resp 14 | Ht 65.5 in | Wt 199.8 lb

## 2014-10-09 DIAGNOSIS — R059 Cough, unspecified: Secondary | ICD-10-CM

## 2014-10-09 DIAGNOSIS — R0981 Nasal congestion: Secondary | ICD-10-CM | POA: Diagnosis not present

## 2014-10-09 DIAGNOSIS — R05 Cough: Secondary | ICD-10-CM | POA: Diagnosis not present

## 2014-10-09 LAB — POCT URINALYSIS DIPSTICK
BILIRUBIN UA: NEGATIVE
Glucose, UA: NEGATIVE
Ketones, UA: NEGATIVE
Leukocytes, UA: NEGATIVE
Nitrite, UA: NEGATIVE
PH UA: 8
PROTEIN UA: NEGATIVE
SPEC GRAV UA: 1.02
Urobilinogen, UA: 0.2

## 2014-10-09 LAB — POCT CBC
Granulocyte percent: 51.8 %G (ref 37–80)
HCT, POC: 39.4 % (ref 37.7–47.9)
HEMOGLOBIN: 12.5 g/dL (ref 12.2–16.2)
LYMPH, POC: 3.3 (ref 0.6–3.4)
MCH: 27.9 pg (ref 27–31.2)
MCHC: 31.8 g/dL (ref 31.8–35.4)
MCV: 87.7 fL (ref 80–97)
MID (cbc): 0.6 (ref 0–0.9)
MPV: 6.3 fL (ref 0–99.8)
POC Granulocyte: 4.1 (ref 2–6.9)
POC LYMPH %: 41.1 % (ref 10–50)
POC MID %: 7.1 %M (ref 0–12)
Platelet Count, POC: 366 10*3/uL (ref 142–424)
RBC: 4.49 M/uL (ref 4.04–5.48)
RDW, POC: 12.6 %
WBC: 8 10*3/uL (ref 4.6–10.2)

## 2014-10-09 MED ORDER — NAPROXEN 500 MG PO TABS: 500.0000 mg | ORAL_TABLET | Freq: Two times a day (BID) | ORAL | Status: DC

## 2014-10-09 MED ORDER — HYDROCODONE-HOMATROPINE 5-1.5 MG/5ML PO SYRP: 5.0000 mL | ORAL_SOLUTION | Freq: Three times a day (TID) | ORAL | Status: DC | PRN

## 2014-10-09 NOTE — Progress Notes (Signed)
10/09/2014 at 10:47 PM  Karen Holder / DOB: Jul 21, 1980 / MRN: [id number]  The patient has Gestational hypertension; Postpartum care following vaginal delivery (1/5); Acute blood loss anemia; and Chronic pelvic pain in female on her problem list.  SUBJECTIVE  Karen Holder is a 34 y.o. ill appearing female presenting for the chief complaint of cough, nasal congestion, headache, and scratchy throat.  These symptoms started roughly 5 days ago and are not changing.  She associates fatigue.  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      She  has a past medical history of Depression; Allergy; Migraines; Hypertension (2014); GERD (gastroesophageal reflux disease); and Chronic pelvic pain in female (05/01/2014).    Medications reviewed and updated by myself where necessary, and exist elsewhere in the encounter.   Karen Holder is allergic to other and eggs or egg-derived products. She  reports that she has quit smoking. She does not have any smokeless tobacco history on file. She reports that she does not drink alcohol or use illicit drugs. She  reports that she currently engages in sexual activity. The patient  has past surgical history that includes Mole removal; No past surgeries; laparoscopy (N/A, 05/01/2014); Laparoscopic lysis of adhesions (05/01/2014); and laparoscopy (05/01/2014).  Her family history includes Hyperlipidemia in her father; Hypertension in her father. There is no history of Other.  Review of Systems  Constitutional: Negative for fever and chills.  Respiratory: Negative for shortness of breath.   Cardiovascular: Negative for chest pain.  Gastrointestinal: Negative for nausea and abdominal pain.  Genitourinary: Negative.   Skin: Negative for rash.  Neurological: Negative for dizziness and headaches.    OBJECTIVE  Her  height is 5' 5.5" (1.664 m) and weight is 199 lb 12.8 oz (90.629 kg).  Her oral temperature is 99.6 F (37.6 C). Her blood pressure is 98/60 and her pulse is 110. Her respiration is 14 and oxygen saturation is 98%.  The patient's body mass index is 32.73 kg/(m^2).  Physical Exam  Constitutional: She is oriented to person, place, and time. She appears well-developed and well-nourished. No distress.  HENT:  Right Ear: Hearing, tympanic membrane, external ear and ear canal normal.  Left Ear: Hearing, tympanic membrane, external ear and ear canal normal.  Nose: Mucosal edema present. Right sinus exhibits no maxillary sinus tenderness and no frontal sinus tenderness. Left sinus exhibits no maxillary sinus tenderness and no frontal sinus tenderness.  Mouth/Throat: Uvula is midline, oropharynx is clear and moist and mucous membranes are normal.  Cardiovascular: Normal rate, regular rhythm and normal heart sounds.   Respiratory: Effort normal and breath sounds normal. She has no wheezes. She has no rales.  Neurological: She is alert and oriented to person, place, and time.  Skin: Skin is warm and dry. She is not diaphoretic.  Psychiatric: She has a normal mood and affect.    Results for orders placed or performed in visit on 10/09/14 (from the past 24 hour(s))  POCT urinalysis dipstick     Status: None   Collection Time: 10/09/14  5:12 PM  Result Value Ref Range   Color, UA yellow    Clarity, UA clear    Glucose, UA neg    Bilirubin, UA neg    Ketones, UA neg    Spec Grav, UA 1.020    Blood, UA trace-lysed    pH, UA 8.0    Protein, UA neg  Urobilinogen, UA 0.2    Nitrite, UA neg    Leukocytes, UA Negative Negative  POCT CBC     Status: None   Collection Time: 10/09/14  5:37 PM  Result Value Ref Range   WBC 8.0 4.6 - 10.2 K/uL   Lymph, poc 3.3 0.6 - 3.4   POC LYMPH PERCENT 41.1 10 - 50 %L   MID (cbc) 0.6 0 - 0.9   POC MID % 7.1 0 - 12 %M   POC Granulocyte 4.1 2 - 6.9   Granulocyte percent 51.8 37 - 80 %G   RBC 4.49 4.04 - 5.48 M/uL   Hemoglobin 12.5  12.2 - 16.2 g/dL   HCT, POC 60.4 54.0 - 47.9 %   MCV 87.7 80 - 97 fL   MCH, POC 27.9 27 - 31.2 pg   MCHC 31.8 31.8 - 35.4 g/dL   RDW, POC 98.1 %   Platelet Count, POC 366 142 - 424 K/uL   MPV 6.3 0 - 99.8 fL    ASSESSMENT & PLAN  Karen Holder was seen today for cough, laryngitis, diarrhea, back pain, neck pain, nausea, chills and fatigue.  Diagnoses and all orders for this visit:  Cough: Her symptoms appear viral in nature and her work up is reassuring.  Will treat symptomatically. Patient advised to follow up in 5 days if she is not improved.   -     POCT CBC -     POCT urinalysis dipstick -     naproxen (NAPROSYN) 500 MG tablet; Take 1 tablet (500 mg total) by mouth 2 (two) times daily with a meal. -     COMPLETE METABOLIC PANEL WITH GFR  Nasal congestion -     HYDROcodone-homatropine (HYCODAN) 5-1.5 MG/5ML syrup; Take 5 mLs by mouth every 8 (eight) hours as needed for cough.    The patient was advised to call or come back to clinic if she does not see an improvement in symptoms, or worsens with the above plan.   Deliah Boston, MHS, PA-C [facility] 10/09/2014 10:47 PM

## 2014-10-10 LAB — COMPLETE METABOLIC PANEL WITH GFR
ALBUMIN: 3.9 g/dL (ref 3.6–5.1)
ALT: 60 U/L — ABNORMAL HIGH (ref 6–29)
AST: 23 U/L (ref 10–30)
Alkaline Phosphatase: 97 U/L (ref 33–115)
BUN: 14 mg/dL (ref 7–25)
CALCIUM: 9.6 mg/dL (ref 8.6–10.2)
CHLORIDE: 104 mmol/L (ref 98–110)
CO2: 25 mmol/L (ref 20–31)
Creat: 0.71 mg/dL (ref 0.50–1.10)
GFR, Est African American: >89 mL/min (ref >=60)
GFR, Est Non African American: >89 mL/min (ref >=60)
Glucose, Bld: 96 mg/dL (ref 65–99)
Potassium: 4.3 mmol/L (ref 3.5–5.3)
Sodium: 140 mmol/L (ref 135–146)
Total Bilirubin: 0.2 mg/dL (ref 0.2–1.2)
Total Protein: 6.7 g/dL (ref 6.1–8.1)

## 2014-10-12 LAB — HEPATITIS PANEL, ACUTE
HCV Ab: NEGATIVE
Hep A IgM: NONREACTIVE
Hep B C IgM: NONREACTIVE
Hepatitis B Surface Ag: NEGATIVE

## 2014-12-14 ENCOUNTER — Other Ambulatory Visit: Payer: Self-pay | Admitting: Family Medicine

## 2015-03-10 ENCOUNTER — Encounter (HOSPITAL_COMMUNITY): Payer: Self-pay | Admitting: *Deleted

## 2015-03-10 ENCOUNTER — Other Ambulatory Visit: Payer: Self-pay | Admitting: Obstetrics & Gynecology

## 2015-03-10 ENCOUNTER — Other Ambulatory Visit (HOSPITAL_COMMUNITY): Payer: Self-pay | Admitting: Obstetrics & Gynecology

## 2015-03-10 ENCOUNTER — Inpatient Hospital Stay (HOSPITAL_COMMUNITY)
Admission: AD | Admit: 2015-03-10 | Discharge: 2015-03-10 | Disposition: A | Discharge status: Home / Self Care | Payer: No Typology Code available for payment source | Source: Ambulatory Visit | Attending: Obstetrics and Gynecology | Admitting: Obstetrics and Gynecology

## 2015-03-10 DIAGNOSIS — O009 Unspecified ectopic pregnancy without intrauterine pregnancy: Secondary | ICD-10-CM | POA: Diagnosis present

## 2015-03-10 LAB — CBC WITH DIFFERENTIAL/PLATELET
BASOS PCT: 0 %
Basophils Absolute: 0 10*3/uL (ref 0.0–0.1)
EOS PCT: 1 %
Eosinophils Absolute: 0.1 10*3/uL (ref 0.0–0.7)
HEMATOCRIT: 42.3 % (ref 36.0–46.0)
HEMOGLOBIN: 14.3 g/dL (ref 12.0–15.0)
Lymphocytes Relative: 39 %
Lymphs Abs: 3.9 10*3/uL (ref 0.7–4.0)
MCH: 30 pg (ref 26.0–34.0)
MCHC: 33.8 g/dL (ref 30.0–36.0)
MCV: 88.9 fL (ref 78.0–100.0)
MONO ABS: 0.5 10*3/uL (ref 0.1–1.0)
Monocytes Relative: 5 %
NEUTROS ABS: 5.6 10*3/uL (ref 1.7–7.7)
Neutrophils Relative %: 55 %
Platelets: 338 10*3/uL (ref 150–400)
RBC: 4.76 MIL/uL (ref 3.87–5.11)
RDW: 13.9 % (ref 11.5–15.5)
WBC: 10.1 10*3/uL (ref 4.0–10.5)

## 2015-03-10 MED ORDER — METHOTREXATE INJECTION FOR WOMEN'S HOSPITAL
50.0000 mg/m2 | Freq: Once | INTRAMUSCULAR | Status: AC
  Administered 2015-03-10: 105 mg via INTRAMUSCULAR
  Filled 2015-03-10: qty 2.1

## 2015-03-10 NOTE — MAU Note (Signed)
Sent from Dr. Camillia HerterMody's office with confirmed left ectopic pregnancy.  Here for Methatrexate and lab work.  Denies vaginal bleeding.

## 2015-03-10 NOTE — Discharge Instructions (Signed)
Methotrexate injection What is this medicine? METHOTREXATE (METH oh TREX ate) is a chemotherapy drug used to treat cancer including breast cancer, leukemia, and lymphoma. This medicine can also be used to treat psoriasis and certain kinds of arthritis. This medicine may be used for other purposes; ask your health care provider or pharmacist if you have questions. What should I tell my health care provider before I take this medicine? They need to know if you have any of these conditions: -fluid in the stomach area or lungs -if you often drink alcohol -infection or immune system problems -kidney disease -liver disease -low blood counts, like low white cell, platelet, or red cell counts -lung disease -radiation therapy -stomach ulcers -ulcerative colitis -an unusual or allergic reaction to methotrexate, other medicines, foods, dyes, or preservatives -pregnant or trying to get pregnant -breast-feeding How should I use this medicine? This medicine is for infusion into a vein or for injection into muscle or into the spinal fluid (whichever applies). It is usually given by a health care professional in a hospital or clinic setting. In rare cases, you might get this medicine at home. You will be taught how to give this medicine. Use exactly as directed. Take your medicine at regular intervals. Do not take your medicine more often than directed. If this medicine is used for arthritis or psoriasis, it should be taken weekly, NOT daily. It is important that you put your used needles and syringes in a special sharps container. Do not put them in a trash can. If you do not have a sharps container, call your pharmacist or healthcare provider to get one. Talk to your pediatrician regarding the use of this medicine in children. While this drug may be prescribed for children as young as 2 years for selected conditions, precautions do apply. Overdosage: If you think you have taken too much of this medicine  contact a poison control center or emergency room at once. NOTE: This medicine is only for you. Do not share this medicine with others. What if I miss a dose? It is important not to miss your dose. Call your doctor or health care professional if you are unable to keep an appointment. If you give yourself the medicine and you miss a dose, talk with your doctor or health care professional. Do not take double or extra doses. What may interact with this medicine? This medicine may interact with the following medications: -acitretin -aspirin or aspirin-like medicines including salicylates -azathioprine -certain antibiotics like chloramphenicol, penicillin, tetracycline -certain medicines for stomach problems like esomeprazole, omeprazole, pantoprazole -cyclosporine -gold -hydroxychloroquine -live virus vaccines -mercaptopurine -NSAIDs, medicines for pain and inflammation, like ibuprofen or naproxen -other cytotoxic agents -penicillamine -phenylbutazone -phenytoin -probenacid -retinoids such as isotretinoin and tretinoin -steroid medicines like prednisone or cortisone -sulfonamides like sulfasalazine and trimethoprim/sulfamethoxazole -theophylline This list may not describe all possible interactions. Give your health care provider a list of all the medicines, herbs, non-prescription drugs, or dietary supplements you use. Also tell them if you smoke, drink alcohol, or use illegal drugs. Some items may interact with your medicine. What should I watch for while using this medicine? Avoid alcoholic drinks. In some cases, you may be given additional medicines to help with side effects. Follow all directions for their use. This medicine can make you more sensitive to the sun. Keep out of the sun. If you cannot avoid being in the sun, wear protective clothing and use sunscreen. Do not use sun lamps or tanning beds/booths. You may get drowsy   or dizzy. Do not drive, use machinery, or do anything that  needs mental alertness until you know how this medicine affects you. Do not stand or sit up quickly, especially if you are an older patient. This reduces the risk of dizzy or fainting spells. You may need blood work done while you are taking this medicine. Call your doctor or health care professional for advice if you get a fever, chills or sore throat, or other symptoms of a cold or flu. Do not treat yourself. This drug decreases your body's ability to fight infections. Try to avoid being around people who are sick. This medicine may increase your risk to bruise or bleed. Call your doctor or health care professional if you notice any unusual bleeding. Check with your doctor or health care professional if you get an attack of severe diarrhea, nausea and vomiting, or if you sweat a lot. The loss of too much body fluid can make it dangerous for you to take this medicine. Talk to your doctor about your risk of cancer. You may be more at risk for certain types of cancers if you take this medicine. Both men and women must use effective birth control with this medicine. Do not become pregnant while taking this medicine or until at least 1 normal menstrual cycle has occurred after stopping it. Women should inform their doctor if they wish to become pregnant or think they might be pregnant. Men should not father a child while taking this medicine and for 3 months after stopping it. There is a potential for serious side effects to an unborn child. Talk to your health care professional or pharmacist for more information. Do not breast-feed an infant while taking this medicine. What side effects may I notice from receiving this medicine? Side effects that you should report to your doctor or health care professional as soon as possible: -allergic reactions like skin rash, itching or hives, swelling of the face, lips, or tongue -back pain -breathing problems or shortness of breath -confusion -diarrhea -dry,  nonproductive cough -low blood counts - this medicine may decrease the number of white blood cells, red blood cells and platelets. You may be at increased risk of infections and bleeding -mouth sores -redness, blistering, peeling or loosening of the skin, including inside the mouth -seizures -severe headaches -signs of infection - fever or chills, cough, sore throat, pain or difficulty passing urine -signs and symptoms of bleeding such as bloody or black, tarry stools; red or dark-brown urine; spitting up blood or brown material that looks like coffee grounds; red spots on the skin; unusual bruising or bleeding from the eye, gums, or nose -signs and symptoms of kidney injury like trouble passing urine or change in the amount of urine -signs and symptoms of liver injury like dark yellow or brown urine; general ill feeling or flu-like symptoms; light-colored stools; loss of appetite; nausea; right upper belly pain; unusually weak or tired; yellowing of the eyes or skin -stiff neck -vomiting Side effects that usually do not require medical attention (report to your doctor or health care professional if they continue or are bothersome): -dizziness -hair loss -headache -stomach pain -upset stomach This list may not describe all possible side effects. Call your doctor for medical advice about side effects. You may report side effects to FDA at 1-800-FDA-1088. Where should I keep my medicine? If you are using this medicine at home, you will be instructed on how to store this medicine. Throw away any unused medicine after   the expiration date on the label. NOTE: This sheet is a summary. It may not cover all possible information. If you have questions about this medicine, talk to your doctor, pharmacist, or health care provider.    2016, [employer]. (2014-06-04 12:36:41)  

## 2015-03-10 NOTE — MAU Note (Signed)
Urine sent to lab 

## 2015-03-13 ENCOUNTER — Inpatient Hospital Stay (HOSPITAL_COMMUNITY)
Admission: AD | Admit: 2015-03-13 | Discharge: 2015-03-13 | Disposition: A | Discharge status: Home / Self Care | Payer: PRIVATE HEALTH INSURANCE | Source: Ambulatory Visit | Attending: Obstetrics and Gynecology | Admitting: Obstetrics and Gynecology

## 2015-03-13 DIAGNOSIS — O001 Tubal pregnancy without intrauterine pregnancy: Secondary | ICD-10-CM | POA: Diagnosis not present

## 2015-03-13 LAB — HCG, QUANTITATIVE, PREGNANCY: HCG, BETA CHAIN, QUANT, S: 294 m[IU]/mL — AB (ref <5)

## 2015-03-13 NOTE — MAU Note (Signed)
Here for follow up, post methotrexate.  Continues to have pain, cramping. No bleeding. Has been constipated and very nauseated.

## 2015-03-13 NOTE — MAU Note (Signed)
Trending of HCG from office : 1/3- 115; 1/6- 216; 1/9- 235; 1/11-308   Received [facility]ate[facility]  [facility]

## 2015-03-20 ENCOUNTER — Emergency Department (HOSPITAL_BASED_OUTPATIENT_CLINIC_OR_DEPARTMENT_OTHER)
Admission: EM | Admit: 2015-03-20 | Discharge: 2015-03-21 | Disposition: A | Discharge status: Home / Self Care | Payer: PRIVATE HEALTH INSURANCE | Attending: Emergency Medicine | Admitting: Emergency Medicine

## 2015-03-20 ENCOUNTER — Emergency Department (HOSPITAL_BASED_OUTPATIENT_CLINIC_OR_DEPARTMENT_OTHER): Payer: PRIVATE HEALTH INSURANCE

## 2015-03-20 ENCOUNTER — Encounter (HOSPITAL_BASED_OUTPATIENT_CLINIC_OR_DEPARTMENT_OTHER): Payer: Self-pay | Admitting: Emergency Medicine

## 2015-03-20 DIAGNOSIS — R102 Pelvic and perineal pain: Secondary | ICD-10-CM

## 2015-03-20 DIAGNOSIS — F329 Major depressive disorder, single episode, unspecified: Secondary | ICD-10-CM | POA: Diagnosis not present

## 2015-03-20 DIAGNOSIS — Z8719 Personal history of other diseases of the digestive system: Secondary | ICD-10-CM | POA: Insufficient documentation

## 2015-03-20 DIAGNOSIS — O009 Unspecified ectopic pregnancy without intrauterine pregnancy: Secondary | ICD-10-CM | POA: Insufficient documentation

## 2015-03-20 DIAGNOSIS — O99341 Other mental disorders complicating pregnancy, first trimester: Secondary | ICD-10-CM | POA: Diagnosis not present

## 2015-03-20 DIAGNOSIS — Z3A00 Weeks of gestation of pregnancy not specified: Secondary | ICD-10-CM | POA: Diagnosis not present

## 2015-03-20 DIAGNOSIS — O9989 Other specified diseases and conditions complicating pregnancy, childbirth and the puerperium: Secondary | ICD-10-CM | POA: Insufficient documentation

## 2015-03-20 DIAGNOSIS — G8929 Other chronic pain: Secondary | ICD-10-CM | POA: Insufficient documentation

## 2015-03-20 DIAGNOSIS — O99351 Diseases of the nervous system complicating pregnancy, first trimester: Secondary | ICD-10-CM | POA: Diagnosis not present

## 2015-03-20 DIAGNOSIS — Z8679 Personal history of other diseases of the circulatory system: Secondary | ICD-10-CM | POA: Diagnosis not present

## 2015-03-20 DIAGNOSIS — R52 Pain, unspecified: Secondary | ICD-10-CM

## 2015-03-20 DIAGNOSIS — Z87891 Personal history of nicotine dependence: Secondary | ICD-10-CM | POA: Insufficient documentation

## 2015-03-20 DIAGNOSIS — O209 Hemorrhage in early pregnancy, unspecified: Secondary | ICD-10-CM | POA: Diagnosis present

## 2015-03-20 DIAGNOSIS — N939 Abnormal uterine and vaginal bleeding, unspecified: Secondary | ICD-10-CM

## 2015-03-20 DIAGNOSIS — R000 Tachycardia, unspecified: Secondary | ICD-10-CM | POA: Insufficient documentation

## 2015-03-20 DIAGNOSIS — Z79899 Other long term (current) drug therapy: Secondary | ICD-10-CM | POA: Insufficient documentation

## 2015-03-20 HISTORY — DX: Unspecified ectopic pregnancy without intrauterine pregnancy: O00.90

## 2015-03-20 LAB — URINALYSIS, ROUTINE W REFLEX MICROSCOPIC
Bilirubin Urine: NEGATIVE
Glucose, UA: NEGATIVE mg/dL
Hgb urine dipstick: NEGATIVE
Ketones, ur: NEGATIVE mg/dL
LEUKOCYTES UA: NEGATIVE
NITRITE: NEGATIVE
PROTEIN: NEGATIVE mg/dL
Specific Gravity, Urine: 1.013 (ref 1.005–1.030)
pH: 7.5 (ref 5.0–8.0)

## 2015-03-20 LAB — PREGNANCY, URINE: PREG TEST UR: NEGATIVE

## 2015-03-20 MED ORDER — OXYCODONE-ACETAMINOPHEN 5-325 MG PO TABS
2.0000 | ORAL_TABLET | Freq: Once | ORAL | Status: AC
  Administered 2015-03-20: 2 via ORAL
  Filled 2015-03-20: qty 2

## 2015-03-20 MED ORDER — ONDANSETRON 8 MG PO TBDP
8.0000 mg | ORAL_TABLET | Freq: Once | ORAL | Status: AC
  Administered 2015-03-20: 8 mg via ORAL
  Filled 2015-03-20: qty 1

## 2015-03-20 NOTE — ED Provider Notes (Signed)
CSN: [id number]     Arrival date & time 03/20/15  2041 History   First MD Initiated Contact with Patient 03/20/15 2121     Chief Complaint  Patient presents with  . Abdominal Pain     (Consider location/radiation/quality/duration/timing/severity/associated sxs/prior Treatment) The history is provided by the patient and medical records. No language interpreter was used.     Karen Holder is a 35 y.o. female  [phone] with a hx of migraines, HTN, GERD presents to the Emergency Department complaining of gradual, persistent, progressively worsening lower abd pain with associated vaginal bleeding onset 4 days ago.  On 03/10/15 pt was diagnosed with ectopic pregnancy at her OB/GYN and was given a methotrexate shot that day.  Pt reports that 2 days ago she began bleeding and having worsening pain. She reports that today her pain was very intense, prompting her ER visit.  No aggravating or alleviating factors.  Associated symptoms include nausea without vomiting. Patient denies fever, chills, weakness, dizziness, dysuria.   LMP: 01/31/15   Past Medical History  Diagnosis Date  . Depression   . Allergy   . Migraines     onset age 89; previous [facility] consultation.  Previous trial fo Topamax, Amitriptyline.  . Hypertension 2014    PIH  . GERD (gastroesophageal reflux disease)   . Chronic pelvic pain in female 05/01/2014  . Ectopic pregnancy    Past Surgical History  Procedure Laterality Date  . Mole removal    . Laparoscopy N/A 05/01/2014    Procedure: LAPAROSCOPY DIAGNOSTIC;  Surgeon: Shea Evans, MD;  Location: [facility];  Service: Gynecology;  Laterality: N/A;  . Laparoscopic lysis of adhesions  05/01/2014    Procedure: LAPAROSCOPIC LYSIS OF ADHESIONS;  Surgeon: Shea Evans, MD;  Location: [facility];  Service: Gynecology;;  . Laparoscopy  05/01/2014    Procedure: LAPAROSCOPY OPERATIVE;  Surgeon: Shea Evans, MD;  Location: [facility];  Service: Gynecology;;   Family History  Problem  Relation Age of Onset  . Other Neg Hx   . Hypertension Father   . Hyperlipidemia Father    Social History  Substance Use Topics  . Smoking status: Former Games developer  . Smokeless tobacco: None  . Alcohol Use: No   OB History    Gravida Para Term Preterm AB TAB SAB Ectopic Multiple Living   Review of Systems  Constitutional: Negative for fever, diaphoresis, appetite change, fatigue and unexpected weight change.  HENT: Negative for mouth sores.   Eyes: Negative for visual disturbance.  Respiratory: Negative for cough, chest tightness, shortness of breath and wheezing.   Cardiovascular: Negative for chest pain.  Gastrointestinal: Positive for abdominal pain. Negative for nausea, vomiting, diarrhea and constipation.  Endocrine: Negative for polydipsia, polyphagia and polyuria.  Genitourinary: Positive for vaginal bleeding. Negative for dysuria, urgency, frequency and hematuria.  Musculoskeletal: Negative for back pain and neck stiffness.  Skin: Negative for rash.  Allergic/Immunologic: Negative for immunocompromised state.  Neurological: Negative for syncope, light-headedness and headaches.  Hematological: Does not bruise/bleed easily.  Psychiatric/Behavioral: Negative for sleep disturbance. The patient is not nervous/anxious.       Allergies  Other and Eggs or egg-derived products  Home Medications   Prior to Admission medications   Medication Sig Start Date End Date Taking? Authorizing Provider  azithromycin (ZITHROMAX) 250 MG tablet Take by mouth daily. Reported on 03/10/2015    Historical Provider, MD  buPROPion (WELLBUTRIN XL) 300  MG 24 hr tablet Take 300 mg by mouth daily.    Historical Provider, MD  calcium carbonate (TUMS - DOSED IN MG ELEMENTAL CALCIUM) 500 MG chewable tablet Chew 2 tablets by mouth 3 (three) times daily as needed for indigestion or heartburn. heartburn    Historical Provider, MD  Doxylamine-Pyridoxine (DICLEGIS) 10-10 MG TBEC Take by  mouth.    Historical Provider, MD  EPINEPHrine (EPIPEN) 0.3 mg/0.3 mL SOAJ injection Inject 0.3 mLs (0.3 mg total) into the muscle once. 01/28/13   Ethelda Chick, MD  HYDROcodone-acetaminophen (NORCO/VICODIN) 5-325 MG tablet Take 1-2 tablets by mouth every 6 (six) hours as needed for moderate pain or severe pain. 03/21/15   Roy Snuffer, PA-C  iron polysaccharides (NIFEREX) 150 MG capsule Take 1 capsule (150 mg total) by mouth daily. 03/05/12   Marlinda Mike, CNM  levocetirizine (XYZAL) 5 MG tablet Take 5 mg by mouth every evening.    Historical Provider, MD  Omega-3 Fatty Acids (FISH OIL) 1000 MG CAPS Take 1 capsule by mouth daily.     Historical Provider, MD  ondansetron (ZOFRAN ODT) 4 MG disintegrating tablet  ODT q4 hours prn nausea/vomit 03/21/15   Nancy Arvin, PA-C  ondansetron (ZOFRAN) 4 MG tablet Take 4 mg by mouth every 8 (eight) hours as needed for nausea or vomiting.    Historical Provider, MD  PARoxetine (PAXIL) 40 MG tablet Take 40 mg by mouth every morning.    Historical Provider, MD  promethazine (PHENERGAN) 25 MG tablet Take 1 tablet (25 mg total) by mouth every 6 (six) hours as needed for nausea or vomiting. 09/21/14   Sherren Mocha, MD  SUMAtriptan (IMITREX) 20 MG/ACT nasal spray PLACE 1 SPRAY INTO THE NOSE EVERY 2 HOURS AS NEEDED FOR MIGRAINE, MAY REPEAT IN 2 HRS IF NEEDED. Patient not taking: Reported on 03/10/2015 09/21/14   Sherren Mocha, MD   BP 103/79 mmHg  Pulse 107  Temp(Src) 98.2 F (36.8 C) (Oral)  Resp 18  Ht  (1.651 m)  Wt 90.266 kg  BMI 33.12 kg/m2  SpO2 99%  LMP 01/31/2015  Breastfeeding? No Physical Exam  Constitutional: She appears well-developed and well-nourished. No distress.  HENT:  Head: Normocephalic and atraumatic.  Mouth/Throat: Oropharynx is clear and moist.  Eyes: Conjunctivae are normal. No scleral icterus.  Neck: Normal range of motion.  Cardiovascular: Regular rhythm, normal heart sounds and intact distal pulses.  Tachycardia  present.   No murmur heard. Pulses:      Radial pulses are 2+ on the right side, and 2+ on the left side.       Dorsalis pedis pulses are 2+ on the right side, and 2+ on the left side.  Pulmonary/Chest: Effort normal and breath sounds normal. No respiratory distress. She has no wheezes.  Abdominal: Soft. Bowel sounds are normal. She exhibits no distension and no mass. There is tenderness in the left lower quadrant. There is no rebound, no guarding and no CVA tenderness. Hernia confirmed negative in the right inguinal area and confirmed negative in the left inguinal area.  Left lower quadrant tenderness without guarding or rebound No CVA tenderness  Genitourinary: Uterus normal. No labial fusion. There is no rash, tenderness or lesion on the right labia. There is no rash, tenderness or lesion on the left labia. Uterus is not deviated, not enlarged, not fixed and not tender. Cervix exhibits no motion tenderness, no discharge and no friability. Right adnexum displays no mass, no tenderness and no fullness. Left adnexum  displays no mass, no tenderness and no fullness. There is bleeding in the vagina. No erythema or tenderness in the vagina. No foreign body around the vagina. No signs of injury around the vagina. No vaginal discharge found.  Moderate amount of blood in the vaginal vault Cervical os is open  Musculoskeletal: Normal range of motion. She exhibits no edema.  Lymphadenopathy:       Right: No inguinal adenopathy present.       Left: No inguinal adenopathy present.  Neurological: She is alert.  Skin: Skin is warm and dry. She is not diaphoretic. No erythema.  Psychiatric: She has a normal mood and affect.  Nursing note and vitals reviewed.   ED Course  Procedures (including critical care time) Labs Review Labs Reviewed  WET PREP, GENITAL - Abnormal; Notable for the following:    WBC, Wet Prep HPF POC MANY (*)    All other components within normal limits  URINALYSIS, ROUTINE W REFLEX  MICROSCOPIC (NOT AT [facility]) - Abnormal; Notable for the following:    APPearance CLOUDY (*)    All other components within normal limits  CBC - Abnormal; Notable for the following:    WBC 12.2 (*)    All other components within normal limits  HCG, QUANTITATIVE, PREGNANCY - Abnormal; Notable for the following:    hCG, Beta Chain, Quant, S 24 (*)    All other components within normal limits  PREGNANCY, URINE  COMPREHENSIVE METABOLIC PANEL    Imaging Review US Transvaginal Non-ob  03/20/2015  CLINICAL DATA:  Follow-up left-sided ectopic pregnancy, status post methotrexate administration 10 days ago. Acute onset of vaginal bleeding and left pelvic pain. Nausea. Initial encounter. EXAM: TRANSABDOMINAL AND TRANSVAGINAL ULTRASOUND OF PELVIS TECHNIQUE: Both transabdominal and transvaginal ultrasound examinations of the pelvis were performed. Transabdominal technique was performed for global imaging of the pelvis including uterus, ovaries, adnexal regions, and pelvic cul-de-sac. It was necessary to proceed with endovaginal exam following the transabdominal exam to visualize the uterus and ovaries in greater detail. COMPARISON:  CT of the abdomen and pelvis from 09/26/2010 FINDINGS: Uterus Measurements: 9.0 x 4.3 x 5.1 cm. No fibroids or other mass visualized. Endometrium Thickness: 1.4 cm. A small complex cystic focus is noted along the inferior aspect of the endometrial canal, measuring 0.6 cm. This is nonspecific. Right ovary Measurements: 2.5 x 2.3 x 1.9 cm. Normal appearance/no adnexal mass. Left ovary Measurements: 3.3 x 1.0 x 2.1 cm. Normal appearance/no adnexal mass. Other findings No free fluid is seen within the pelvic cul-de-sac. IMPRESSION: 1. Previously noted left adnexal ectopic pregnancy is no longer visualized. Ovaries unremarkable in appearance. No evidence for ovarian torsion. 2. Small complex cystic focus along the inferior aspect of the endometrial canal is nonspecific but may simply reflect  the secretory phase of the patient's ovulatory cycle. The endometrial echo complex remains somewhat thickened, measuring 1.4 cm. Electronically Signed   By: Roanna Raider M.D.   On: 03/20/2015 22:58   US Pelvis Complete  03/20/2015  CLINICAL DATA:  Follow-up left-sided ectopic pregnancy, status post methotrexate administration 10 days ago. Acute onset of vaginal bleeding and left pelvic pain. Nausea. Initial encounter. EXAM: TRANSABDOMINAL AND TRANSVAGINAL ULTRASOUND OF PELVIS TECHNIQUE: Both transabdominal and transvaginal ultrasound examinations of the pelvis were performed. Transabdominal technique was performed for global imaging of the pelvis including uterus, ovaries, adnexal regions, and pelvic cul-de-sac. It was necessary to proceed with endovaginal exam following the transabdominal exam to visualize the uterus and ovaries in greater detail. COMPARISON:  CT of the abdomen and pelvis from 09/26/2010 FINDINGS: Uterus Measurements: 9.0 x 4.3 x 5.1 cm. No fibroids or other mass visualized. Endometrium Thickness: 1.4 cm. A small complex cystic focus is noted along the inferior aspect of the endometrial canal, measuring 0.6 cm. This is nonspecific. Right ovary Measurements: 2.5 x 2.3 x 1.9 cm. Normal appearance/no adnexal mass. Left ovary Measurements: 3.3 x 1.0 x 2.1 cm. Normal appearance/no adnexal mass. Other findings No free fluid is seen within the pelvic cul-de-sac. IMPRESSION: 1. Previously noted left adnexal ectopic pregnancy is no longer visualized. Ovaries unremarkable in appearance. No evidence for ovarian torsion. 2. Small complex cystic focus along the inferior aspect of the endometrial canal is nonspecific but may simply reflect the secretory phase of the patient's ovulatory cycle. The endometrial echo complex remains somewhat thickened, measuring 1.4 cm. Electronically Signed   By: Roanna Raider M.D.   On: 03/20/2015 22:58   I have personally reviewed and evaluated these images and lab  results as part of my medical decision-making.    MDM   Final diagnoses:  Pain  Pelvic pain in female  Ectopic pregnancy  Vaginal bleeding   BAY JARQUIN presents with lower quadrant abdominal pain since midweek with recent diagnosis of ectopic pregnancy. Patient treated with methotrexate. Labs are generally reassuring. HCG 24. Mild leukocytosis noted. Ultrasound shows resolving ectopic. No cervical motion tenderness. Pelvic exam with persistent left adnexal tenderness however no palpable mass. Cervical os is open. No evidence of urinary tract infection. Patient is tolerating by mouth here in the emergency department. She is afebrile without hypotension. We'll plan for a DC home with pain control. She is follow-up with her OB/GYN on Monday.  Repeat abdominal examination shows soft and minimally abdomen without rebound or guarding.  BP 107/74 mmHg  Pulse 102  Temp(Src) 98.2 F (36.8 C) (Oral)  Resp 18  Ht  (1.651 m)  Wt 90.266 kg  BMI 33.12 kg/m2  SpO2 97%  LMP 01/31/2015  Breastfeeding? No   Dierdre Forth, PA-C 03/21/15 1610  Blane Ohara, MD 03/22/15 [phone]

## 2015-03-20 NOTE — ED Notes (Signed)
Pt alert, NAD, calm, interactive, resps e/u, speaking in clear complete sentences, no dyspnea noted, family at [facility], c/o pain and nausea, (denies: vd, fever, bleeding, sob, dizziness or other sx), husband states, "she hasn't been herself", pt to [place] via stretcher.

## 2015-03-20 NOTE — ED Notes (Signed)
Pelvic cart placed in room. Advised by RN not to set the bed up at this moment.

## 2015-03-20 NOTE — ED Notes (Signed)
Diagnosed with Ectopic Pregnancy 03/09/15.  Got Methotrexate inj.  Left low abd pain ever since and getting worse.  Tonight dysuria. Vaginal bleeding x2 days. Has seen obgyn again x2 since the 10th. Next appt this coming Tuesday.

## 2015-03-21 LAB — HCG, QUANTITATIVE, PREGNANCY: hCG, Beta Chain, Quant, S: 24 m[IU]/mL — ABNORMAL HIGH (ref <5)

## 2015-03-21 LAB — WET PREP, GENITAL
Clue Cells Wet Prep HPF POC: NONE SEEN
SPERM: NONE SEEN
TRICH WET PREP: NONE SEEN
YEAST WET PREP: NONE SEEN

## 2015-03-21 LAB — CBC
HCT: 41.2 % (ref 36.0–46.0)
HEMOGLOBIN: 13.6 g/dL (ref 12.0–15.0)
MCH: 29.4 pg (ref 26.0–34.0)
MCHC: 33 g/dL (ref 30.0–36.0)
MCV: 89 fL (ref 78.0–100.0)
PLATELETS: 304 10*3/uL (ref 150–400)
RBC: 4.63 MIL/uL (ref 3.87–5.11)
RDW: 13.7 % (ref 11.5–15.5)
WBC: 12.2 10*3/uL — ABNORMAL HIGH (ref 4.0–10.5)

## 2015-03-21 LAB — COMPREHENSIVE METABOLIC PANEL
ALT: 33 U/L (ref 14–54)
AST: 19 U/L (ref 15–41)
Albumin: 3.8 g/dL (ref 3.5–5.0)
Alkaline Phosphatase: 81 U/L (ref 38–126)
Anion gap: 8 (ref 5–15)
BUN: 16 mg/dL (ref 6–20)
CALCIUM: 9.7 mg/dL (ref 8.9–10.3)
CO2: 28 mmol/L (ref 22–32)
CREATININE: 0.78 mg/dL (ref 0.44–1.00)
Chloride: 103 mmol/L (ref 101–111)
GFR calc Af Amer: >60 mL/min (ref >60)
GFR calc non Af Amer: >60 mL/min (ref >60)
GLUCOSE: 98 mg/dL (ref 65–99)
Potassium: 3.6 mmol/L (ref 3.5–5.1)
SODIUM: 139 mmol/L (ref 135–145)
Total Bilirubin: 0.3 mg/dL (ref 0.3–1.2)
Total Protein: 6.7 g/dL (ref 6.5–8.1)

## 2015-03-21 MED ORDER — SODIUM CHLORIDE 0.9 % IV BOLUS (SEPSIS)
500.0000 mL | Freq: Once | INTRAVENOUS | Status: AC
  Administered 2015-03-21: 500 mL via INTRAVENOUS

## 2015-03-21 MED ORDER — ONDANSETRON 4 MG PO TBDP: ORAL_TABLET | ORAL | Status: AC

## 2015-03-21 MED ORDER — HYDROCODONE-ACETAMINOPHEN 5-325 MG PO TABS: 1.0000 | ORAL_TABLET | Freq: Four times a day (QID) | ORAL | Status: AC | PRN

## 2015-03-21 NOTE — ED Notes (Addendum)
Steady gait, denies needs or questions, given rx x2, out with husband, "feel better". Denies nausea, pain 6/10

## 2015-03-21 NOTE — Discharge Instructions (Signed)
1. Medications: zofran, vicodin, usual home medications 2. Treatment: rest, drink plenty of fluids, advance diet slowly 3. Follow Up: Please followup with your OB/GYN in 2 days for discussion of your diagnoses and further evaluation after today's visit; if you do not have a primary care doctor use the resource guide provided to find one; Please return to the ER for persistent vomiting, high fevers or worsening symptoms

## 2015-03-22 ENCOUNTER — Other Ambulatory Visit: Payer: Self-pay | Admitting: Obstetrics & Gynecology

## 2015-05-11 ENCOUNTER — Other Ambulatory Visit: Payer: Self-pay | Admitting: Family Medicine

## 2015-08-18 ENCOUNTER — Other Ambulatory Visit: Payer: Self-pay | Admitting: Obstetrics & Gynecology

## 2015-08-30 NOTE — Patient Instructions (Addendum)
Your procedure is scheduled on: Thursday, July 13  Enter through the Main Entrance of [facility] at: 11:30am  Pick up the phone at the desk and dial [phone].  Call this number if you have problems the morning of surgery: [phone].  Remember: Do NOT eat food after midnight Wednesday  Do NOT drink clear liquids after 7am Thursday, day of surgery  Take these medicines the morning of surgery with a SIP OF WATER: effexor and klonopin if needed.  Ok to use nasal inhaler.  Bring albuterol inhaler with you on day of surgery.  Do NOT wear jewelry (body piercing), metal hair clips/bobby pins, make-up, or nail polish. Do NOT wear lotions, powders, or perfumes.  You may wear deoderant. Do NOT shave for 48 hours prior to surgery. Do NOT bring valuables to the hospital.  Leave suitcase in car.  After surgery it may be brought to your room.  For patients admitted to the hospital, checkout time is 11:00 AM the day of discharge. Home with mother Noreene LarssonJill cell [phone].

## 2015-09-01 ENCOUNTER — Encounter (HOSPITAL_COMMUNITY): Payer: Self-pay

## 2015-09-01 ENCOUNTER — Encounter (HOSPITAL_COMMUNITY)
Admission: RE | Admit: 2015-09-01 | Discharge: 2015-09-01 | Disposition: A | Discharge status: Home / Self Care | Payer: PRIVATE HEALTH INSURANCE | Source: Ambulatory Visit | Attending: Obstetrics & Gynecology | Admitting: Obstetrics & Gynecology

## 2015-09-01 DIAGNOSIS — Z029 Encounter for administrative examinations, unspecified: Secondary | ICD-10-CM | POA: Insufficient documentation

## 2015-09-01 HISTORY — DX: Anxiety disorder, unspecified: F41.9

## 2015-09-01 LAB — TYPE AND SCREEN
ABO/RH(D): O POS
ANTIBODY SCREEN: NEGATIVE

## 2015-09-01 LAB — CBC
HEMATOCRIT: 42 % (ref 36.0–46.0)
HEMOGLOBIN: 14.1 g/dL (ref 12.0–15.0)
MCH: 29.9 pg (ref 26.0–34.0)
MCHC: 33.6 g/dL (ref 30.0–36.0)
MCV: 89 fL (ref 78.0–100.0)
Platelets: 286 10*3/uL (ref 150–400)
RBC: 4.72 MIL/uL (ref 3.87–5.11)
RDW: 13.1 % (ref 11.5–15.5)
WBC: 7.8 10*3/uL (ref 4.0–10.5)

## 2015-09-01 LAB — BASIC METABOLIC PANEL
ANION GAP: 6 (ref 5–15)
BUN: 19 mg/dL (ref 6–20)
CO2: 30 mmol/L (ref 22–32)
Calcium: 9.8 mg/dL (ref 8.9–10.3)
Chloride: 97 mmol/L — ABNORMAL LOW (ref 101–111)
Creatinine, Ser: 0.81 mg/dL (ref 0.44–1.00)
GFR calc non Af Amer: >60 mL/min (ref >60)
GLUCOSE: 91 mg/dL (ref 65–99)
POTASSIUM: 4.7 mmol/L (ref 3.5–5.1)
Sodium: 133 mmol/L — ABNORMAL LOW (ref 135–145)

## 2015-09-09 ENCOUNTER — Observation Stay (HOSPITAL_COMMUNITY)
Admission: RE | Admit: 2015-09-09 | Discharge: 2015-09-10 | Disposition: A | Discharge status: Home / Self Care | Payer: PRIVATE HEALTH INSURANCE | Source: Ambulatory Visit | Attending: Obstetrics & Gynecology | Admitting: Obstetrics & Gynecology

## 2015-09-09 ENCOUNTER — Encounter (HOSPITAL_COMMUNITY): Payer: Self-pay

## 2015-09-09 ENCOUNTER — Ambulatory Visit (HOSPITAL_COMMUNITY): Payer: PRIVATE HEALTH INSURANCE | Admitting: Anesthesiology

## 2015-09-09 ENCOUNTER — Encounter (HOSPITAL_COMMUNITY)
Admission: RE | Disposition: A | Discharge status: Home / Self Care | Payer: Self-pay | Source: Ambulatory Visit | Attending: Obstetrics & Gynecology

## 2015-09-09 DIAGNOSIS — Z90710 Acquired absence of both cervix and uterus: Secondary | ICD-10-CM | POA: Insufficient documentation

## 2015-09-09 DIAGNOSIS — Z87891 Personal history of nicotine dependence: Secondary | ICD-10-CM | POA: Diagnosis not present

## 2015-09-09 DIAGNOSIS — N941 Unspecified dyspareunia: Secondary | ICD-10-CM | POA: Insufficient documentation

## 2015-09-09 DIAGNOSIS — R102 Pelvic and perineal pain: Principal | ICD-10-CM | POA: Insufficient documentation

## 2015-09-09 DIAGNOSIS — G8929 Other chronic pain: Secondary | ICD-10-CM | POA: Insufficient documentation

## 2015-09-09 HISTORY — PX: ROBOTIC ASSISTED TOTAL HYSTERECTOMY WITH SALPINGECTOMY: SHX6679

## 2015-09-09 LAB — PREGNANCY, URINE: Preg Test, Ur: NEGATIVE

## 2015-09-09 SURGERY — ROBOTIC ASSISTED TOTAL HYSTERECTOMY WITH SALPINGECTOMY
Anesthesia: General | Site: Abdomen | Laterality: Bilateral

## 2015-09-09 MED ORDER — MENTHOL 3 MG MT LOZG
1.0000 | LOZENGE | OROMUCOSAL | Status: DC | PRN
  Filled 2015-09-09: qty 9

## 2015-09-09 MED ORDER — ALBUTEROL SULFATE (2.5 MG/3ML) 0.083% IN NEBU
3.0000 mL | INHALATION_SOLUTION | Freq: Four times a day (QID) | RESPIRATORY_TRACT | Status: DC | PRN
Start: 2015-09-09 — End: 2015-09-10

## 2015-09-09 MED ORDER — KETOROLAC TROMETHAMINE 30 MG/ML IJ SOLN: 30.0000 mg | Freq: Once | INTRAMUSCULAR | Status: DC

## 2015-09-09 MED ORDER — DEXAMETHASONE SODIUM PHOSPHATE 10 MG/ML IJ SOLN
INTRAMUSCULAR | Status: DC | PRN
  Administered 2015-09-09: 10 mg via INTRAVENOUS

## 2015-09-09 MED ORDER — CEFAZOLIN SODIUM-DEXTROSE 2-4 GM/100ML-% IV SOLN
2.0000 g | INTRAVENOUS | Status: AC
  Administered 2015-09-09: 2 g via INTRAVENOUS

## 2015-09-09 MED ORDER — SODIUM CHLORIDE 0.9 % IV SOLN
INTRAVENOUS | Status: DC | PRN
  Administered 2015-09-09: 70 mL

## 2015-09-09 MED ORDER — FENTANYL CITRATE (PF) 250 MCG/5ML IJ SOLN
INTRAMUSCULAR | Status: AC
  Filled 2015-09-09: qty 5

## 2015-09-09 MED ORDER — HYDROMORPHONE HCL 1 MG/ML IJ SOLN
0.5000 mg | Freq: Once | INTRAMUSCULAR | Status: AC
  Administered 2015-09-09: 0.5 mg via INTRAVENOUS

## 2015-09-09 MED ORDER — LACTATED RINGERS IV SOLN
INTRAVENOUS | Status: DC
  Administered 2015-09-09 (×2): via INTRAVENOUS

## 2015-09-09 MED ORDER — SODIUM CHLORIDE 0.9 % IJ SOLN
INTRAMUSCULAR | Status: AC
  Filled 2015-09-09: qty 50

## 2015-09-09 MED ORDER — PROMETHAZINE HCL 25 MG/ML IJ SOLN: 6.2500 mg | INTRAMUSCULAR | Status: DC | PRN

## 2015-09-09 MED ORDER — PROPOFOL 10 MG/ML IV BOLUS
INTRAVENOUS | Status: AC
  Filled 2015-09-09: qty 20

## 2015-09-09 MED ORDER — FENTANYL CITRATE (PF) 100 MCG/2ML IJ SOLN
INTRAMUSCULAR | Status: DC | PRN
Start: 2015-09-09 — End: 2015-09-09
  Administered 2015-09-09 (×2): 100 ug via INTRAVENOUS
  Administered 2015-09-09: 50 ug via INTRAVENOUS
  Administered 2015-09-09 (×2): 100 ug via INTRAVENOUS

## 2015-09-09 MED ORDER — LIDOCAINE HCL (CARDIAC) 20 MG/ML IV SOLN
INTRAVENOUS | Status: AC
  Filled 2015-09-09: qty 5

## 2015-09-09 MED ORDER — SCOPOLAMINE 1 MG/3DAYS TD PT72
1.0000 | MEDICATED_PATCH | Freq: Once | TRANSDERMAL | Status: DC
  Administered 2015-09-09: 1.5 mg via TRANSDERMAL

## 2015-09-09 MED ORDER — DEXAMETHASONE SODIUM PHOSPHATE 10 MG/ML IJ SOLN
INTRAMUSCULAR | Status: AC
  Filled 2015-09-09: qty 1

## 2015-09-09 MED ORDER — LACTATED RINGERS IV SOLN
INTRAVENOUS | Status: DC
  Administered 2015-09-09: 18:00:00 via INTRAVENOUS

## 2015-09-09 MED ORDER — MEPERIDINE HCL 25 MG/ML IJ SOLN: 6.2500 mg | INTRAMUSCULAR | Status: DC | PRN

## 2015-09-09 MED ORDER — ROPIVACAINE HCL 5 MG/ML IJ SOLN
INTRAMUSCULAR | Status: AC
  Filled 2015-09-09: qty 60

## 2015-09-09 MED ORDER — ONDANSETRON HCL 4 MG/2ML IJ SOLN
INTRAMUSCULAR | Status: AC
  Filled 2015-09-09: qty 2

## 2015-09-09 MED ORDER — MAGNESIUM 500 MG PO TABS: 3.0000 | ORAL_TABLET | Freq: Every day | ORAL | Status: DC

## 2015-09-09 MED ORDER — ZOLPIDEM TARTRATE 5 MG PO TABS: 5.0000 mg | ORAL_TABLET | Freq: Every day | ORAL | Status: DC

## 2015-09-09 MED ORDER — HYDROMORPHONE HCL 1 MG/ML IJ SOLN
0.5000 mg | INTRAMUSCULAR | Status: DC | PRN
  Administered 2015-09-09: 0.5 mg via INTRAVENOUS
  Filled 2015-09-09: qty 1

## 2015-09-09 MED ORDER — SUGAMMADEX SODIUM 200 MG/2ML IV SOLN
INTRAVENOUS | Status: AC
  Filled 2015-09-09: qty 2

## 2015-09-09 MED ORDER — ROCURONIUM BROMIDE 100 MG/10ML IV SOLN
INTRAVENOUS | Status: AC
  Filled 2015-09-09: qty 1

## 2015-09-09 MED ORDER — VENLAFAXINE HCL ER 75 MG PO CP24
225.0000 mg | ORAL_CAPSULE | Freq: Every day | ORAL | Status: DC
  Administered 2015-09-10: 225 mg via ORAL
  Filled 2015-09-09 (×3): qty 1

## 2015-09-09 MED ORDER — PROPOFOL 10 MG/ML IV BOLUS
INTRAVENOUS | Status: DC | PRN
  Administered 2015-09-09: 200 mg via INTRAVENOUS

## 2015-09-09 MED ORDER — MIDAZOLAM HCL 2 MG/2ML IJ SOLN
INTRAMUSCULAR | Status: AC
  Filled 2015-09-09: qty 2

## 2015-09-09 MED ORDER — KETOROLAC TROMETHAMINE 30 MG/ML IJ SOLN
30.0000 mg | Freq: Once | INTRAMUSCULAR | Status: DC
Start: 2015-09-09 — End: 2015-09-09

## 2015-09-09 MED ORDER — KETOROLAC TROMETHAMINE 30 MG/ML IJ SOLN
INTRAMUSCULAR | Status: AC
  Filled 2015-09-09: qty 1

## 2015-09-09 MED ORDER — SODIUM CHLORIDE 0.9 % IJ SOLN
INTRAMUSCULAR | Status: AC
  Filled 2015-09-09: qty 10

## 2015-09-09 MED ORDER — HYDROMORPHONE HCL 1 MG/ML IJ SOLN
INTRAMUSCULAR | Status: AC
  Administered 2015-09-09: 0.5 mg via INTRAVENOUS
  Filled 2015-09-09: qty 1

## 2015-09-09 MED ORDER — LIDOCAINE 2% (20 MG/ML) 5 ML SYRINGE
INTRAMUSCULAR | Status: DC | PRN
  Administered 2015-09-09: 100 mg via INTRAVENOUS

## 2015-09-09 MED ORDER — HYDROMORPHONE HCL 1 MG/ML IJ SOLN
0.2500 mg | INTRAMUSCULAR | Status: DC | PRN
Start: 2015-09-09 — End: 2015-09-09
  Administered 2015-09-09 (×3): 0.5 mg via INTRAVENOUS

## 2015-09-09 MED ORDER — HYDROCODONE-ACETAMINOPHEN 5-325 MG PO TABS
1.0000 | ORAL_TABLET | Freq: Four times a day (QID) | ORAL | Status: DC | PRN
  Administered 2015-09-09 (×2): 1 via ORAL
  Administered 2015-09-10 (×2): 2 via ORAL
  Filled 2015-09-09: qty 2
  Filled 2015-09-09 (×2): qty 1
  Filled 2015-09-09: qty 2

## 2015-09-09 MED ORDER — SUGAMMADEX SODIUM 200 MG/2ML IV SOLN
INTRAVENOUS | Status: DC | PRN
  Administered 2015-09-09: 200 mg via INTRAVENOUS

## 2015-09-09 MED ORDER — ARTIFICIAL TEARS OP OINT
TOPICAL_OINTMENT | OPHTHALMIC | Status: DC | PRN
  Administered 2015-09-09: 1 via OPHTHALMIC

## 2015-09-09 MED ORDER — ARTIFICIAL TEARS OP OINT
TOPICAL_OINTMENT | OPHTHALMIC | Status: AC
  Filled 2015-09-09: qty 3.5

## 2015-09-09 MED ORDER — ONDANSETRON HCL 4 MG PO TABS: 4.0000 mg | ORAL_TABLET | Freq: Four times a day (QID) | ORAL | Status: DC | PRN

## 2015-09-09 MED ORDER — LACTATED RINGERS IR SOLN
Status: DC | PRN
  Administered 2015-09-09: 3000 mL

## 2015-09-09 MED ORDER — MIDAZOLAM HCL 5 MG/5ML IJ SOLN
INTRAMUSCULAR | Status: DC | PRN
  Administered 2015-09-09: 2 mg via INTRAVENOUS

## 2015-09-09 MED ORDER — ONDANSETRON HCL 4 MG/2ML IJ SOLN
INTRAMUSCULAR | Status: DC | PRN
  Administered 2015-09-09: 4 mg via INTRAVENOUS

## 2015-09-09 MED ORDER — ROCURONIUM BROMIDE 100 MG/10ML IV SOLN
INTRAVENOUS | Status: DC | PRN
  Administered 2015-09-09: 60 mg via INTRAVENOUS
  Administered 2015-09-09 (×2): 20 mg via INTRAVENOUS

## 2015-09-09 MED ORDER — MAGNESIUM OXIDE 400 (241.3 MG) MG PO TABS
400.0000 mg | ORAL_TABLET | Freq: Every day | ORAL | Status: DC
  Administered 2015-09-09: 400 mg via ORAL
  Filled 2015-09-09 (×2): qty 1

## 2015-09-09 MED ORDER — KETOROLAC TROMETHAMINE 30 MG/ML IJ SOLN
INTRAMUSCULAR | Status: DC | PRN
  Administered 2015-09-09: 30 mg via INTRAVENOUS

## 2015-09-09 MED ORDER — ONDANSETRON HCL 4 MG/2ML IJ SOLN: 4.0000 mg | Freq: Four times a day (QID) | INTRAMUSCULAR | Status: DC | PRN

## 2015-09-09 SURGICAL SUPPLY — 53 items
BARRIER ADHS 3X4 INTERCEED (GAUZE/BANDAGES/DRESSINGS) IMPLANT
BRR ADH 4X3 ABS CNTRL BYND (GAUZE/BANDAGES/DRESSINGS)
CATH FOLEY 3WAY  5CC 16FR (CATHETERS) ×2
CATH FOLEY 3WAY 5CC 16FR (CATHETERS) ×1 IMPLANT
CLOTH BEACON ORANGE TIMEOUT ST (SAFETY) ×3 IMPLANT
CONT PATH 16OZ SNAP LID 3702 (MISCELLANEOUS) ×3 IMPLANT
COVER BACK TABLE 60X90IN (DRAPES) ×6 IMPLANT
COVER LIGHT HANDLE  1/PK (MISCELLANEOUS) ×2
COVER LIGHT HANDLE 1/PK (MISCELLANEOUS) IMPLANT
COVER TIP SHEARS 8 DVNC (MISCELLANEOUS) ×1 IMPLANT
COVER TIP SHEARS 8MM DA VINCI (MISCELLANEOUS) ×2
DECANTER SPIKE VIAL GLASS SM (MISCELLANEOUS) ×3 IMPLANT
DEFOGGER SCOPE WARMER CLEARIFY (MISCELLANEOUS) ×3 IMPLANT
DURAPREP 26ML APPLICATOR (WOUND CARE) ×3 IMPLANT
ELECT REM PT RETURN 9FT ADLT (ELECTROSURGICAL) ×3
ELECTRODE REM PT RTRN 9FT ADLT (ELECTROSURGICAL) ×1 IMPLANT
GAUZE VASELINE 3X9 (GAUZE/BANDAGES/DRESSINGS) IMPLANT
GLOVE BIO SURGEON STRL SZ7 (GLOVE) ×6 IMPLANT
GLOVE BIOGEL PI IND STRL 7.0 (GLOVE) ×5 IMPLANT
GLOVE BIOGEL PI INDICATOR 7.0 (GLOVE) ×10
GLOVE ECLIPSE 6.5 STRL STRAW (GLOVE) ×9 IMPLANT
KIT ACCESSORY DA VINCI DISP (KITS) ×2
KIT ACCESSORY DVNC DISP (KITS) ×1 IMPLANT
LEGGING LITHOTOMY PAIR STRL (DRAPES) ×3 IMPLANT
LIQUID BAND (GAUZE/BANDAGES/DRESSINGS) ×3 IMPLANT
MANIPULATOR UTERINE 4.5 ZUMI (MISCELLANEOUS) IMPLANT
OCCLUDER COLPOPNEUMO (BALLOONS) ×3 IMPLANT
PACK ROBOT WH (CUSTOM PROCEDURE TRAY) ×3 IMPLANT
PACK ROBOTIC GOWN (GOWN DISPOSABLE) ×3 IMPLANT
PAD PREP 24X48 CUFFED NSTRL (MISCELLANEOUS) ×6 IMPLANT
PAD TRENDELENBURG POSITION (MISCELLANEOUS) ×3 IMPLANT
SET CYSTO W/LG BORE CLAMP LF (SET/KITS/TRAYS/PACK) IMPLANT
SET IRRIG TUBING LAPAROSCOPIC (IRRIGATION / IRRIGATOR) ×3 IMPLANT
SET TRI-LUMEN FLTR TB AIRSEAL (TUBING) ×2 IMPLANT
SUT VICRYL 0 27 CT2 27 ABS (SUTURE) ×2 IMPLANT
SUT VICRYL 0 UR6 27IN ABS (SUTURE) ×3 IMPLANT
SUT VICRYL 4-0 PS2 18IN ABS (SUTURE) ×6 IMPLANT
SUT VLOC 180 0 9IN  GS21 (SUTURE) ×2
SUT VLOC 180 0 9IN GS21 (SUTURE) IMPLANT
SYR 30ML LL (SYRINGE) ×3 IMPLANT
SYR 50ML LL SCALE MARK (SYRINGE) ×3 IMPLANT
SYSTEM CONVERTIBLE TROCAR (TROCAR) ×3 IMPLANT
TIP RUMI ORANGE 6.7MMX12CM (TIP) IMPLANT
TIP UTERINE 5.1X6CM LAV DISP (MISCELLANEOUS) IMPLANT
TIP UTERINE 6.7X10CM GRN DISP (MISCELLANEOUS) IMPLANT
TIP UTERINE 6.7X6CM WHT DISP (MISCELLANEOUS) IMPLANT
TIP UTERINE 6.7X8CM BLUE DISP (MISCELLANEOUS) ×2 IMPLANT
TOWEL OR 17X24 6PK STRL BLUE (TOWEL DISPOSABLE) ×6 IMPLANT
TROCAR 12M 150ML BLUNT (TROCAR) ×3 IMPLANT
TROCAR DISP BLADELESS 8 DVNC (TROCAR) IMPLANT
TROCAR DISP BLADELESS 8MM (TROCAR) ×2
TROCAR PORT AIRSEAL 5X120 (TROCAR) ×2 IMPLANT
WATER STERILE IRR 1000ML POUR (IV SOLUTION) ×9 IMPLANT

## 2015-09-09 NOTE — Anesthesia Postprocedure Evaluation (Signed)
Anesthesia Post Note  Patient: Karen Holder  Procedure(s) Performed: Procedure(s) (LRB): ROBOTIC ASSISTED TOTAL HYSTERECTOMY WITH SALPINGECTOMY (Bilateral)  Patient location during evaluation: PACU Anesthesia Type: General Level of consciousness: sedated Pain management: pain level controlled Vital Signs Assessment: post-procedure vital signs reviewed and stable Respiratory status: spontaneous breathing Cardiovascular status: stable Postop Assessment: no signs of nausea or vomiting     Last Vitals:  Filed Vitals:   09/09/15 1650 09/09/15 1700  BP:  115/73  Pulse: 74 72  Temp:    Resp: 17 16    Last Pain:  Filed Vitals:   09/09/15 1703  PainSc: 3    Pain Goal: Patients Stated Pain Goal: 3 (09/09/15 1151)               Kaho Selle JR,JOHN Susann GivensFRANKLIN

## 2015-09-09 NOTE — Transfer of Care (Signed)
Immediate Anesthesia Transfer of Care Note  Patient: Karen Holder  Procedure(s) Performed: Procedure(s): ROBOTIC ASSISTED TOTAL HYSTERECTOMY WITH SALPINGECTOMY (Bilateral)  Patient Location: PACU  Anesthesia Type:General  Level of Consciousness: awake, alert  and oriented  Airway & Oxygen Therapy: Patient Spontanous Breathing and Patient connected to nasal cannula oxygen  Post-op Assessment: Report given to RN and Post -op Vital signs reviewed and stable  Post vital signs: Reviewed and stable  Last Vitals:  Filed Vitals:   09/09/15 1151  BP: 115/82  Pulse: 85  Temp: 36.4 C  Resp: 18    Last Pain:  Filed Vitals:   09/09/15 1153  PainSc: 0-No pain      Patients Stated Pain Goal: 3 (09/09/15 1151)  Complications: No apparent anesthesia complications

## 2015-09-09 NOTE — Op Note (Signed)
Preoperative diagnosis: Pelvic pain, dyspareunia  Postop diagnosis: Same Procedure: da Vinci robot assisted total laparoscopic hysterectomy and bilateral salpingectomy Anesthesia Gen. Endotracheal Surgeon: Dr. Shea Evans Assistant: Marlinda Mike, CNM, Ivonne Andrew, CNM (in training) IV fluids: 1200 cc LR EBL: 50 cc Urine output: 50 cc, clear Complications: none Pathology: Uterus with cervix and both fallopian tubes Disposition: PACU, stable Findings: Normal uterus but deviated to the left. Normal fallopian tubes and ovaries but short left round ligament that kept the uterus tilted to the left. Increased vascularity in the broad ligament. Both ureters seen with normal peristalsis at the start and end of the case.  Normal appearing liver surface. Normal gross appearance of bowel and omentum  Procedure:  Indication: -- Long standing left lower quadrant pain about 5 yrs, left side deep dyspareunia and intolerance to birth control hormones causing migraines. Patient declined continuing birthcontrol pills due to side effects though it did help. Complications of surgery including infection, bleeding, damage to internal organs and other surgery related problems including pneumonia, VTE reviewed and informed written consent was obtained. Also reviewed possible return of pain after hysterectomy. She understood and gave informed written consent.   Patient was brought to the operating room with IV running. She received 2 gm  Ancef. Underwent general anesthesia without difficulty and was given dorsal lithotomy position, prepped and draped in sterile fashion. Foley catheter was placed. Cervix was exposed with a speculum and anterior lip of the cervix was grasped with tenaculum. Uterus was sounded to 8 cm. A  # 8 Rumi tip and a small  Koh ring was assembled on the [employer] and entered in the uterine cavity and balloon was inflated to secure it in place. Koh ring was palpated again cervico-vaginal  junction. Speculum was removed, tenaculum was left on the cervix.  Attention was focused on abdomen. Supraumbilical 12 mm vertical incision made with scalpel after injecting Rupivacaine, fascia dissected, grasped with Kocher's and incised, posterior rectus sheath and peritoneum grasped, incised, intraabdominal entry confirmed. Purse string stay stitch on 0-Vicryl taken on fascia and Hassan cannula introduced and Vicryl sutures secured on the Hassan cannula. Pneumoperitoneum was begun. Laparoscope was introduced and the peritoneal cavity was evaluated. There was no evidence of adhesions. Trendelenburg position given. Patient was sliding up to the head with Trendelenburg, so position was readjusted and surgery was performed with less trendelenburg position. Port sited marked and injected with Rupivacaine. One Robotic cannulas inserted on each side and one assist port on left upper quadrant under vision. Robot was docked from patient's right. PK and scissors introduced through Robotic arms.   Dr. Juliene Pina scrubbed out and went for surgical console.  Uterus, ovaries, tubes and ureters evaluated, appeared normal except uterus was more deviated to the left with short tight round ligament and IP ligament. Ureters seen well. No other disease noted. Ovaries were free, no cul de sac or ovarian fossa adhesions noted.   Uterus was deviated to the patient's left. The right tube grasped and mesosalpinx was desiccated and cut. Then round ligament was desiccated and cut, right utero-ovarian ligament desiccated and cut. Anterior and posterior broad ligament fold dissected and incised and bladder pushed away.  Right uterine vessels skeletonized and cauterized.  Uterus was deviated to the right. Left tube grasped and mesosalpinx cauterized and cut. Then left round ligament desiccated and cut and then left utero-ovarian ligament dissected and cut. Anterior broad ligament was incised to complete bladder flap, bladder was pushed away  by blunt and sharp dissection  with excellent hemostasis, uterine vessels skeletonized and desiccated and cut.  Koh ring impression at cervicovaginal junction was seen well anteriorly. Vaginal occluder inflated. Colpotomy was begun starting from midline anteriorly coming to the left and right and then circumferentially staying above the uterosacral ligaments posteriorly. Uterus, cervix, both tubes were pulled out of the vaginal opening and vaginal occluder was placed back for pneumoperitoneum.  Vaginal cut edges were evaluated for hemostasis which was excellent. Irrigation was performed pedicles appeared dry. Robotic instruments switched for needle driver and long tip tissue forceps. V-lock 0 used for suturing of vaginal cuff in 2 layers. Irrigation was performed, all pedicles appeared to be hemostatic.  Robotic instruments were removed. Robot was de-docked.  Needle of V-lock removed. Survey of pelvis done, hemostasis noted, bilateral ureteral peristalsis noted. Liver surface appeared normal.  Cannulas were removed under vision. Laparoscope and central port removed under vision. The stay sutures at the fascia tied together with excellent fascial closure. Skin approximated with subcuticular stitches on 4-0 Vicryl. Dermabond was applied. No vaginal bleeding noted on vaginal exam at the end.  All instruments/lap/sponges counts were correct x2.  No complications. Patient tolerated procedure well and was reversed from anesthesia and brought to the PACU stable condition.    I was the surgeon for the entire case.   V.Shaquel Chavous, MD

## 2015-09-09 NOTE — Anesthesia Preprocedure Evaluation (Addendum)
Anesthesia Evaluation  Patient identified by MRN, date of birth, ID band Patient awake    Reviewed: Allergy & Precautions, H&P , NPO status , Patient's Chart, lab work & pertinent test results  Airway Mallampati: I  TM Distance: >3 FB Neck ROM: full    Dental no notable dental hx. (+) Teeth Intact   Pulmonary asthma , former smoker,  Will use Albuterol; prior to OR   Pulmonary exam normal        Cardiovascular hypertension, Normal cardiovascular exam     Neuro/Psych  Headaches, PSYCHIATRIC DISORDERS    GI/Hepatic Neg liver ROS,   Endo/Other  negative endocrine ROSObesity  Renal/GU negative Renal ROS  negative genitourinary   Musculoskeletal negative musculoskeletal ROS (+)   Abdominal Normal abdominal exam  (+) + obese,   Peds  Hematology negative hematology ROS (+) anemia ,   Anesthesia Other Findings   Reproductive/Obstetrics negative OB ROS Pelvic Pain                           Anesthesia Physical  Anesthesia Plan  ASA: II  Anesthesia Plan: General   Post-op Pain Management:    Induction: Intravenous  Airway Management Planned: Oral ETT  Additional Equipment:   Intra-op Plan:   Post-operative Plan: Extubation in OR  Informed Consent: I have reviewed the patients History and Physical, chart, labs and discussed the procedure including the risks, benefits and alternatives for the proposed anesthesia with the patient or authorized representative who has indicated his/her understanding and acceptance.   Dental Advisory Given  Plan Discussed with: CRNA and Surgeon  Anesthesia Plan Comments:         Anesthesia Quick Evaluation

## 2015-09-09 NOTE — Anesthesia Procedure Notes (Signed)
Procedure Name: Intubation Date/Time: 09/09/2015 1:07 PM Performed by: Jhonnie GarnerMARSHALL, Kriti Katayama M Pre-anesthesia Checklist: Patient identified, Emergency Drugs available, Suction available and Patient being monitored Patient Re-evaluated:Patient Re-evaluated prior to inductionOxygen Delivery Method: Circle system utilized Preoxygenation: Pre-oxygenation with 100% oxygen Intubation Type: IV induction Ventilation: Mask ventilation without difficulty Laryngoscope Size: Miller and 2 Grade View: Grade I Tube type: Oral Tube size: 7.0 mm Number of attempts: 1 Airway Equipment and Method: Stylet Placement Confirmation: ETT inserted through vocal cords under direct vision,  positive ETCO2 and breath sounds checked- equal and bilateral Secured at: 21 cm Tube secured with: Tape Dental Injury: Teeth and Oropharynx as per pre-operative assessment

## 2015-09-09 NOTE — H&P (Signed)
Karen Holder is an 35 y.o. female  [phone] (term SVD, then SAB, then Ectopic-treated with Methotrexate), c/o chronic pelvic pain, especially left lower area and dyspareunia since 2012. Her diagnostic laparoscopy in Mar'16 was negative for disease. She continues to have very specific LLQ pain and left sided deep dyspareunia. She did well with OCs, but cannot continue due to hormonal sidfe effects, wt gain, worsening migraines.   She had first mentioned this pain in 2012 with acute on chronic pelvic pain, hx of GI infection in early 20s, constipation without GI bleeding. Denied dysmenorrhea but was on extended OCs for menstrual migraines prevention. Noted deep dyspareunia, no PID/STD hx.  She had a spontaneous pregnancy in 2013 with term vaginal birth in Jan'14.  She was back on OCs and helped pelvic pain then but stopped OCs in mid-2015 due to side effects, incl worsening migraines, decreased libido and desire to conceive. She was seen in office again in Jan'16 for pelvic pain, now off OCs since trying to conceive, c/o dull pelvic pain with superimposed suprapubic pain, dyspareunia, worsening migraines and worsening constipation.  She is taking Imitrex, Narcotics, Fioricet, Phenergan for migraines regularly, Effexor for depression.   Paps normal, HPV negative in Apr'15.  FamHx Migraines (mother), HTN, MI.   Patient's last menstrual period was 09/04/2015.    Past Medical History  Diagnosis Date  . Depression   . Allergy     use inhalers for allergies  . Migraines     onset age 28; previous [facility] consultation.  Previous trial fo Topamax, Amitriptyline.  . Chronic pelvic pain in female 05/01/2014  . Ectopic pregnancy     treat with methotrexate, no surgery required  . Anxiety   . GERD (gastroesophageal reflux disease)     otc tums prn  . SVD (spontaneous vaginal delivery) 2014    HTN in pregnancy, resolved with delivery   . Hypertension 2014    with pregnancy    Past  Surgical History  Procedure Laterality Date  . Mole removal      mole on vaginal area removed   . Laparoscopy N/A 05/01/2014    Procedure: LAPAROSCOPY DIAGNOSTIC;  Surgeon: Shea Evans, MD;  Location: [facility];  Service: Gynecology;  Laterality: N/A;  . Laparoscopic lysis of adhesions  05/01/2014    Procedure: LAPAROSCOPIC LYSIS OF ADHESIONS;  Surgeon: Shea Evans, MD;  Location: [facility];  Service: Gynecology;;  . Laparoscopy  05/01/2014    Procedure: LAPAROSCOPY OPERATIVE;  Surgeon: Shea Evans, MD;  Location: [facility];  Service: Gynecology;;  . Wisdom tooth extraction      Family History  Problem Relation Age of Onset  . Other Neg Hx   . Hypertension Father   . Hyperlipidemia Father     Social History:  reports that she quit smoking about 4 years ago. Her smoking use included Cigarettes. She has a 3.5 pack-year smoking history. She has never used smokeless tobacco. She reports that she drinks alcohol. She reports that she does not use illicit drugs.  Allergies:  Allergies  Allergen Reactions  . Other Swelling    Bee stings     Prescriptions prior to admission  Medication Sig Dispense Refill Last Dose  . albuterol (PROVENTIL HFA;VENTOLIN HFA) 108 (90 Base) MCG/ACT inhaler Inhale 2 puffs into the lungs every 6 (six) hours as needed for wheezing or shortness of breath.   09/08/2015 at Unknown time  . amphetamine-dextroamphetamine (ADDERALL XR) 30 MG 24 hr capsule Take 30 mg by mouth every  morning.   Past Week at Unknown time  . b complex vitamins tablet Take 1 tablet by mouth daily.   09/08/2015 at Unknown time  . Beclomethasone Dipropionate (QNASL) 80 MCG/ACT AERS Place 2 sprays into both nostrils every morning.   Past Week at Unknown time  . calcium carbonate (TUMS - DOSED IN MG ELEMENTAL CALCIUM) 500 MG chewable tablet Chew 2 tablets by mouth 3 (three) times daily as needed for indigestion or heartburn. heartburn   Past Week at Unknown time  . CINNAMON PO Take 1,000 mg by mouth 2  (two) times daily.   09/08/2015 at Unknown time  . clonazePAM (KLONOPIN) 2 MG tablet Take 2 mg by mouth 3 (three) times daily as needed for anxiety.   09/09/2015 at 0400  . diphenhydrAMINE (BENADRYL) 25 MG tablet Take 25 mg by mouth 2 (two) times daily as needed for allergies.   Past Week at Unknown time  . folic acid (FOLVITE) 400 MCG tablet Take 400 mcg by mouth daily.   Past Week at Unknown time  . iron polysaccharides (NIFEREX) 150 MG capsule Take 1 capsule (150 mg total) by mouth daily. 30 capsule 0 Past Week at Unknown time  . Magnesium 500 MG TABS Take 3 tablets by mouth at bedtime.   09/08/2015 at Unknown time  . Multiple Vitamin (MULTIVITAMIN WITH MINERALS) TABS tablet Take 1 tablet by mouth at bedtime.   09/08/2015 at Unknown time  . Omega-3 Fatty Acids (FISH OIL) 1000 MG CAPS Take 1 capsule by mouth daily.    Past Week at Unknown time  . Omega-3 Fatty Acids (FISH OIL) 1000 MG CAPS Take 1 capsule by mouth at bedtime.   Past Week at Unknown time  . venlafaxine XR (EFFEXOR-XR) 75 MG 24 hr capsule Take 225 mg by mouth daily with breakfast.    09/09/2015 at 0600  . zolpidem (AMBIEN) 10 MG tablet Take 10 mg by mouth at bedtime.   09/08/2015 at Unknown time  . EPINEPHrine (EPIPEN) 0.3 mg/0.3 mL SOAJ injection Inject 0.3 mLs (0.3 mg total) into the muscle once. 2 Device 3 Taking  . HYDROcodone-acetaminophen (NORCO/VICODIN) 5-325 MG tablet Take 1-2 tablets by mouth every 6 (six) hours as needed for moderate pain or severe pain. (Patient not taking: Reported on 08/26/2015) 15 tablet 0 Not Taking at Unknown time  . ondansetron (ZOFRAN ODT) 4 MG disintegrating tablet 4mg  ODT q4 hours prn nausea/vomit (Patient not taking: Reported on 08/26/2015) 4 tablet 0 Not Taking at Unknown time  . SUMAtriptan (IMITREX) 20 MG/ACT nasal spray PLACE 1 SPRAY INTO THE NOSE EVERY 2 HOURS AS NEEDED FOR MIGRAINE, MAY REPEAT IN 2 HRS IF NEEDED. (Patient taking differently: Place 20 mg into the nose every 2 (two) hours as needed for  migraine. PLACE 1 SPRAY INTO THE NOSE EVERY 2 HOURS AS NEEDED FOR MIGRAINE, MAY REPEAT IN 2 HRS IF NEEDED.) 1 Inhaler 2 Not Taking at Unknown time    ROS  LLQ pain  Blood pressure 115/82, pulse 85, temperature 97.6 F (36.4 C), temperature source Oral, resp. rate 18, last menstrual period 09/04/2015, SpO2 100 %. Physical Exam Physical exam:  A&O x 3, no acute distress. Pleasant HEENT neg, no thyromegaly Lungs CTA bilat CV RRR, S1S2 normal Abdo soft, non tender, non acute Extr no edema/ tenderness Pelvic- left uterine and uterosacral tenderness, no mass  Results for orders placed or performed during the hospital encounter of 09/09/15 (from the past 24 hour(s))  Pregnancy, urine     Status:  None   Collection Time: 09/09/15 11:30 AM  Result Value Ref Range   Preg Test, Ur NEGATIVE NEGATIVE    No results found.  Assessment/Plan: 35 yo female with left lower quadrant pain and deep dyspareunia, intolerance to hormones, here for [employer] assisted total hysterectomy and bilateral salpingectomy. . Risks/complications of surgery reviewed incl infection, bleeding, damage to internal organs including bladder, bowels, ureters, blood vessels, other risks from anesthesia, VTE and delayed complications of any surgery, complications in future surgery reviewed. Also discussed potential no benefits to pain or return of symptoms   Birdena Kingma R 09/09/2015, 12:35 PM

## 2015-09-10 ENCOUNTER — Encounter (HOSPITAL_COMMUNITY): Payer: Self-pay | Admitting: Obstetrics & Gynecology

## 2015-09-10 DIAGNOSIS — R102 Pelvic and perineal pain: Secondary | ICD-10-CM | POA: Diagnosis not present

## 2015-09-10 LAB — CBC
HEMATOCRIT: 39.9 % (ref 36.0–46.0)
HEMOGLOBIN: 13.2 g/dL (ref 12.0–15.0)
MCH: 29.7 pg (ref 26.0–34.0)
MCHC: 33.1 g/dL (ref 30.0–36.0)
MCV: 89.9 fL (ref 78.0–100.0)
Platelets: 265 10*3/uL (ref 150–400)
RBC: 4.44 MIL/uL (ref 3.87–5.11)
RDW: 13.2 % (ref 11.5–15.5)
WBC: 11.7 10*3/uL — ABNORMAL HIGH (ref 4.0–10.5)

## 2015-09-10 LAB — BASIC METABOLIC PANEL
Anion gap: 6 (ref 5–15)
BUN: 14 mg/dL (ref 6–20)
CALCIUM: 9.2 mg/dL (ref 8.9–10.3)
CO2: 29 mmol/L (ref 22–32)
CREATININE: 0.74 mg/dL (ref 0.44–1.00)
Chloride: 103 mmol/L (ref 101–111)
GFR calc non Af Amer: >60 mL/min (ref >60)
GLUCOSE: 115 mg/dL — AB (ref 65–99)
Potassium: 4.5 mmol/L (ref 3.5–5.1)
Sodium: 138 mmol/L (ref 135–145)

## 2015-09-10 MED ORDER — IBUPROFEN 200 MG PO TABS: 600.0000 mg | ORAL_TABLET | Freq: Four times a day (QID) | ORAL | Status: AC | PRN

## 2015-09-10 MED ORDER — HYDROCODONE-ACETAMINOPHEN 5-325 MG PO TABS: 1.0000 | ORAL_TABLET | Freq: Four times a day (QID) | ORAL | Status: AC | PRN

## 2015-09-10 MED ORDER — DIPHENHYDRAMINE HCL 25 MG PO CAPS
25.0000 mg | ORAL_CAPSULE | Freq: Once | ORAL | Status: AC
  Administered 2015-09-10: 25 mg via ORAL
  Filled 2015-09-10: qty 1

## 2015-09-10 NOTE — Anesthesia Postprocedure Evaluation (Signed)
Anesthesia Post Note  Patient: Shirlyn Goltzara N Hanselman-Morgan  Procedure(s) Performed: Procedure(s) (LRB): ROBOTIC ASSISTED TOTAL HYSTERECTOMY WITH SALPINGECTOMY (Bilateral)  Patient location during evaluation: [facility] Anesthesia Type: General Level of consciousness: awake, oriented and awake and alert Pain management: pain level controlled Vital Signs Assessment: post-procedure vital signs reviewed and stable Respiratory status: spontaneous breathing and respiratory function stable Cardiovascular status: stable Postop Assessment: no signs of nausea or vomiting and adequate PO intake     Last Vitals:  Filed Vitals:   09/10/15 0536 09/10/15 1144  BP: 108/72 102/63  Pulse: 51 72  Temp: 36.6 C 36.8 C  Resp: 18 16    Last Pain:  Filed Vitals:   09/10/15 1233  PainSc: 5    Pain Goal: Patients Stated Pain Goal: 3 (09/10/15 1130)               Madison HickmanGREGORY,Aerin Delany

## 2015-09-10 NOTE — Progress Notes (Signed)
1 Day Post-Op Procedure(s) (LRB): ROBOTIC ASSISTED TOTAL HYSTERECTOMY WITH SALPINGECTOMY (Bilateral)  Subjective: Patient reports some left side pain this morning after bending. No n/v/f/c/bleeding . Tolerating regular diet. No SOB/CP/ LE swelling  Objective: I have reviewed patient's vital signs, intake and output, medications and labs. BP 108/72 mmHg  Pulse 51  Temp(Src) 97.8 F (36.6 C) (Oral)  Resp 18  Ht 5' 4.75" (1.645 m)  Wt 187 lb (84.823 kg)  BMI 31.35 kg/m2  SpO2 100%  LMP 09/04/2015  General: alert and cooperative Resp: clear to auscultation bilaterally Cardio: regular rate and rhythm, S1, S2 normal, no murmur, click, rub or gallop GI: soft, non-tender; bowel sounds normal; no masses,  no organomegaly Extremities: extremities normal, atraumatic, no cyanosis or edema Vaginal Bleeding: none Incisiona clean and dry, no abdominal wall hematoma noted. Active BS  Assessment: s/p Procedure(s): ROBOTIC ASSISTED TOTAL HYSTERECTOMY WITH SALPINGECTOMY (Bilateral): stable, progressing well, tolerating diet and pain is muscular   Plan: Advance diet Discharge home    F/up 2 wks Surgical findings and Postop care reviewed  Karen Holder 09/10/2015, 7:21 AM

## 2015-09-10 NOTE — Addendum Note (Signed)
Addendum  created 09/10/15 1343 by Shanon PayorSuzanne M Dejon Jungman, CRNA   Modules edited: Clinical Notes   Clinical Notes:  File: [id number]

## 2015-09-10 NOTE — Discharge Summary (Signed)
Physician Discharge Summary  Patient ID: Karen Holder N Hanselman-Morgan MRN: [id number] DOB/AGE: 36-Jan-1982 36 y.o.  Admit date: 09/09/2015 Discharge date: 09/10/2015  Admission Diagnoses: Pelvic pain, dyspareunia   Discharge Diagnoses:  Active Problems:   S/P laparoscopic hysterectomy and bilateral salpingectomy with [employer]inc[employer] robot assistance  Discharged Condition: good  Hospital Course: Uncomplicated surgery and post-op recovery  Discharge Exam: Blood pressure 102/63, pulse 72, temperature 98.3 F (36.8 C), temperature source Oral, resp. rate 16, height 5' 4.75" (1.645 m), weight 187 lb (84.823 kg), last menstrual period 09/04/2015, SpO2 98 %. Normal exam, lungs clear, CV RRR, abdo soft, non acute, active bowel sounds.Extr normal, no DVT Pain well controlled with Norco and will add Ibuprofen  Surgical findings reviewed and post-op care reviewed.   Disposition: 01-Home or Self Care  Discharge Instructions    Call MD for:  difficulty breathing, headache or visual disturbances    Complete by:  As directed      Call MD for:  extreme fatigue    Complete by:  As directed      Call MD for:  hives    Complete by:  As directed      Call MD for:  persistant dizziness or light-headedness    Complete by:  As directed      Call MD for:  persistant nausea and vomiting    Complete by:  As directed      Call MD for:  redness, tenderness, or signs of infection (pain, swelling, redness, odor or green/yellow discharge around incision site)    Complete by:  As directed      Call MD for:  severe uncontrolled pain    Complete by:  As directed      Call MD for:  temperature >100.4    Complete by:  As directed      Call MD for:    Complete by:  As directed   Vaginal bleeding     Diet - low sodium heart healthy    Complete by:  As directed      Discharge wound care:    Complete by:  As directed   Keep clean and dry. Remove honey comb dressing in 5 days     Driving Restrictions    Complete by:   As directed   2 weeks     Increase activity slowly    Complete by:  As directed      Lifting restrictions    Complete by:  As directed   25 lbs only until 6 weeks     Sexual Activity Restrictions    Complete by:  As directed   None until 6-8 wks            Medication List    TAKE these medications        albuterol 108 (90 Base) MCG/ACT inhaler  Commonly known as:  PROVENTIL HFA;VENTOLIN HFA  Inhale 2 puffs into the lungs every 6 (six) hours as needed for wheezing or shortness of breath.     amphetamine-dextroamphetamine 30 MG 24 hr capsule  Commonly known as:  ADDERALL XR  Take 30 mg by mouth every morning.     b complex vitamins tablet  Take 1 tablet by mouth daily.     calcium carbonate 500 MG chewable tablet  Commonly known as:  TUMS - dosed in mg elemental calcium  Chew 2 tablets by mouth 3 (three) times daily as needed for indigestion or heartburn. heartburn     CINNAMON PO  Take 1,000 mg by mouth 2 (two) times daily.     clonazePAM 2 MG tablet  Commonly known as:  KLONOPIN  Take 2 mg by mouth 3 (three) times daily as needed for anxiety.     diphenhydrAMINE 25 MG tablet  Commonly known as:  BENADRYL  Take 25 mg by mouth 2 (two) times daily as needed for allergies.     EPINEPHrine 0.3 mg/0.3 mL Soaj injection  Commonly known as:  EPIPEN  Inject 0.3 mLs (0.3 mg total) into the muscle once.     Fish Oil 1000 MG Caps  Take 1 capsule by mouth at bedtime.     Fish Oil 1000 MG Caps  Take 1 capsule by mouth daily.     folic acid 400 MCG tablet  Commonly known as:  FOLVITE  Take 400 mcg by mouth daily.     HYDROcodone-acetaminophen 5-325 MG tablet  Commonly known as:  NORCO/VICODIN  Take 1-2 tablets by mouth every 6 (six) hours as needed for moderate pain or severe pain.     HYDROcodone-acetaminophen 5-325 MG tablet  Commonly known as:  NORCO/VICODIN  Take 1-2 tablets by mouth every 6 (six) hours as needed for moderate pain or severe pain.     ibuprofen  200 MG tablet  Commonly known as:  MOTRIN IB  Take 3 tablets (600 mg total) by mouth every 6 (six) hours as needed for mild pain or moderate pain.     iron polysaccharides 150 MG capsule  Commonly known as:  NIFEREX  Take 1 capsule (150 mg total) by mouth daily.     Magnesium 500 MG Tabs  Take 3 tablets by mouth at bedtime.     multivitamin with minerals Tabs tablet  Take 1 tablet by mouth at bedtime.     ondansetron 4 MG disintegrating tablet  Commonly known as:  ZOFRAN ODT  $Remove T q4 hours prn nausea/vomit     QNASL 80 MCG/ACT Aers  Generic drug:  Beclomethasone Dipropionate  Place 2 sprays into both nostrils every morning.     SUMAtriptan 20 MG/ACT nasal spray  Commonly known as:  IMITREX  PLACE 1 SPRAY INTO THE NOSE EVERY 2 HOURS AS NEEDED FOR MIGRAINE, MAY REPEAT IN 2 HRS IF NEEDED.     venlafaxine XR 75 MG 24 hr capsule  Commonly known as:  EFFEXOR-XR  Take 225 mg by mouth daily with breakfast.     zolpidem 10 MG tablet  Commonly known as:  AMBIEN  Take 10 mg by mouth at bedtime.           Follow-up Information    Follow up with Gaylin Bulthuis R, MD. Schedule an appointment as soon as possible for a visit in 2 weeks.   Specialty:  Obstetrics and Gynecology   Contact information:   [address] [place] [address] [phone]       Signed: Robley Fries 09/10/2015, 12:52 PM

## 2016-07-06 ENCOUNTER — Ambulatory Visit (HOSPITAL_BASED_OUTPATIENT_CLINIC_OR_DEPARTMENT_OTHER)
Admission: RE | Admit: 2016-07-06 | Discharge: 2016-07-06 | Disposition: A | Discharge status: Home / Self Care | Payer: PRIVATE HEALTH INSURANCE | Source: Ambulatory Visit | Attending: Neurosurgery | Admitting: Neurosurgery

## 2016-07-06 ENCOUNTER — Other Ambulatory Visit: Payer: Self-pay | Admitting: Neurosurgery

## 2016-07-06 ENCOUNTER — Ambulatory Visit (HOSPITAL_BASED_OUTPATIENT_CLINIC_OR_DEPARTMENT_OTHER): Payer: PRIVATE HEALTH INSURANCE

## 2016-07-06 DIAGNOSIS — R51 Headache: Secondary | ICD-10-CM | POA: Diagnosis not present

## 2016-07-06 DIAGNOSIS — R519 Headache, unspecified: Secondary | ICD-10-CM

## 2016-07-28 ENCOUNTER — Ambulatory Visit: Payer: PRIVATE HEALTH INSURANCE | Admitting: Family

## 2016-08-11 ENCOUNTER — Ambulatory Visit: Payer: PRIVATE HEALTH INSURANCE | Admitting: Family

## 2017-10-18 IMAGING — CT CT HEAD W/O CM
3 series · 15 of 46 positions shown, 18 images · non-contrast
Comparison: MRI brain 01/11/2005.

CLINICAL DATA: Patient passed out and hit head. This occurred
yesterday. Blurred vision with slurred speech.

EXAM:
CT HEAD WITHOUT CONTRAST
TECHNIQUE: Contiguous axial images were obtained from the base of the skull
through the vertex without intravenous contrast.

[Series 2: head wo · axial · 0.44mm/px · z∈[-137,-17]mm · 9 of 29 slices shown, 12 images]
[im 3/29  brain]
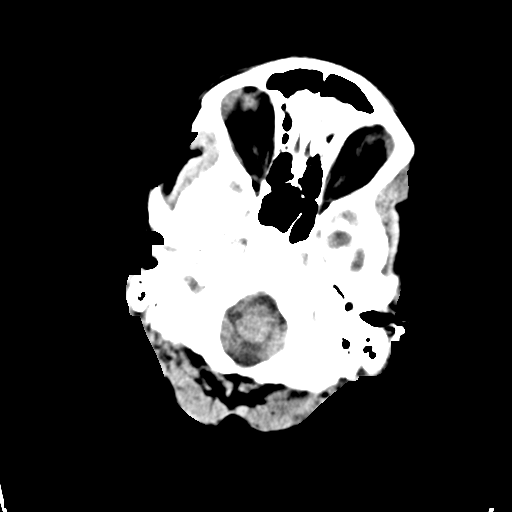
[im 3/29  bone]
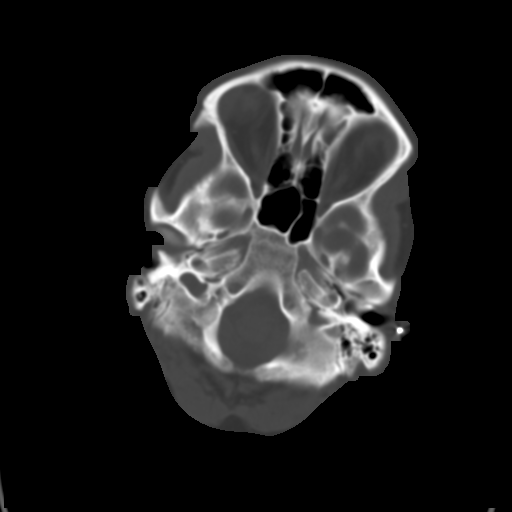
[im 6/29  brain]
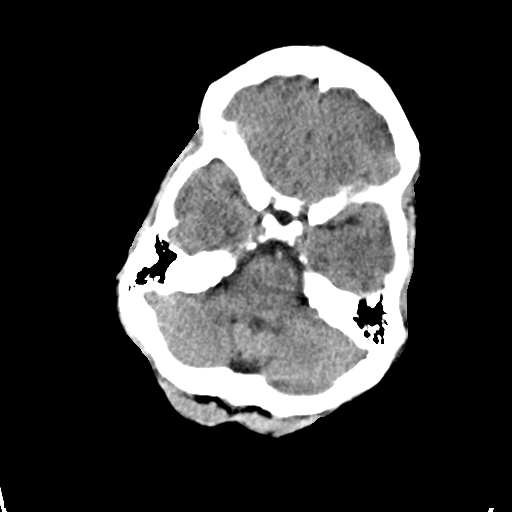
[im 9/29  brain]
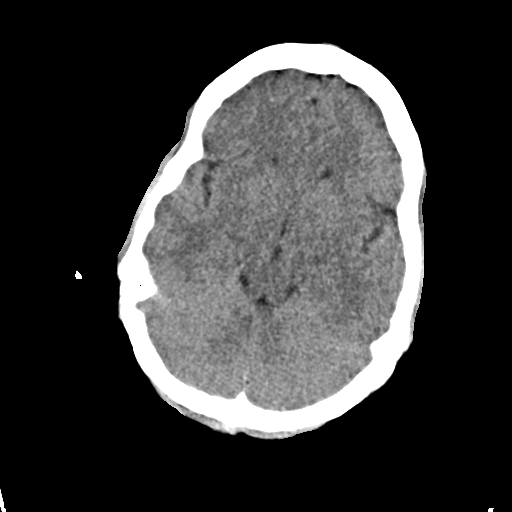
[im 12/29  brain]
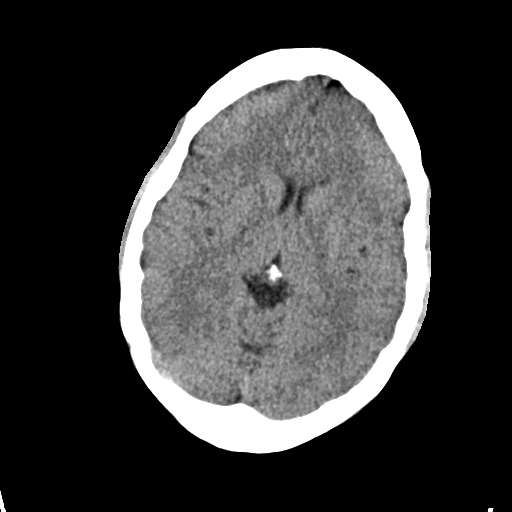
[im 15/29  brain]
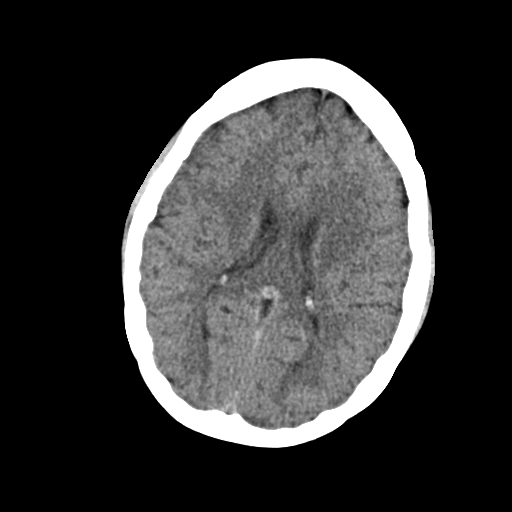
[im 15/29  bone]
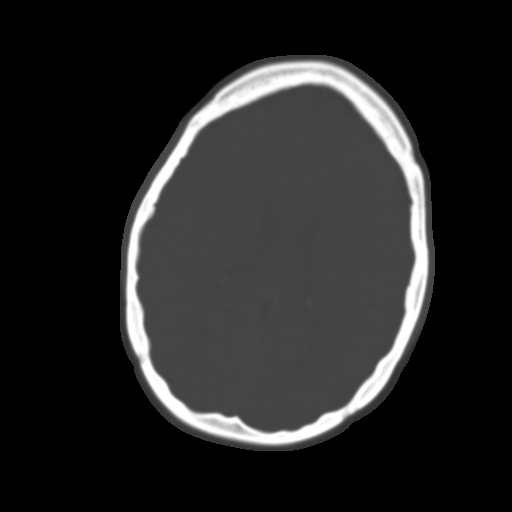
[im 18/29  brain]
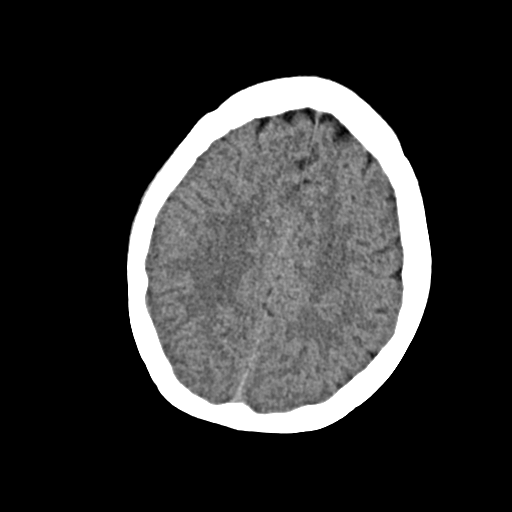
[im 21/29  brain]
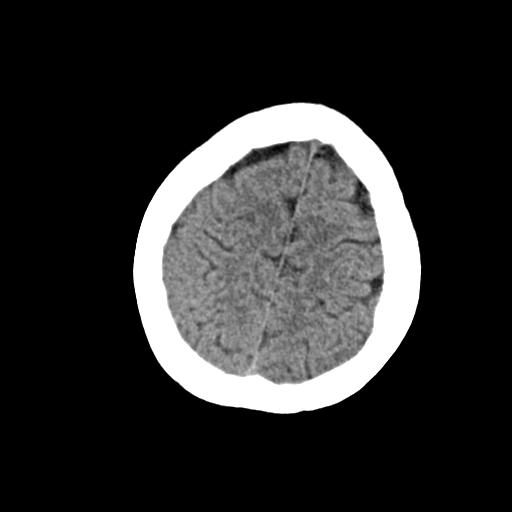
[im 24/29  brain]
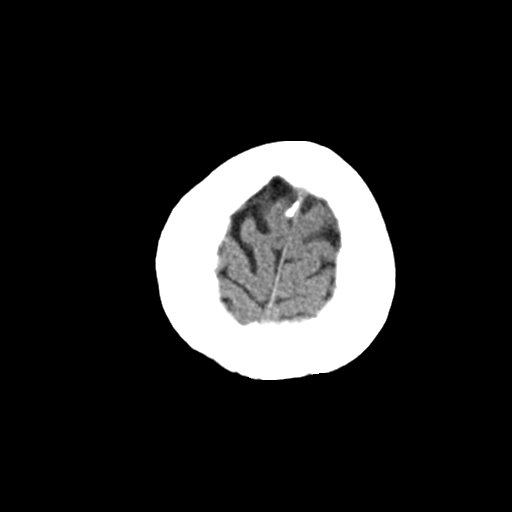
[im 27/29  brain]
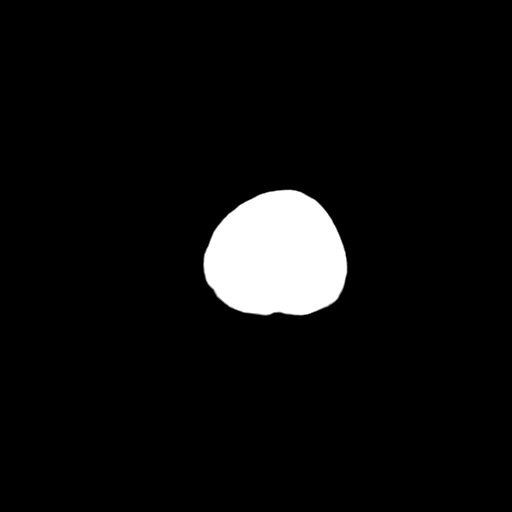
[im 27/29  bone]
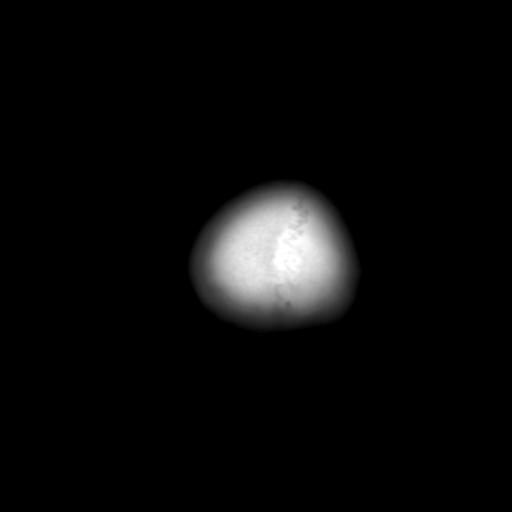

[Series 4: coronal soft · coronal · 0.29mm/px · 3 of 67 slices shown]
[im 23/67  brain]
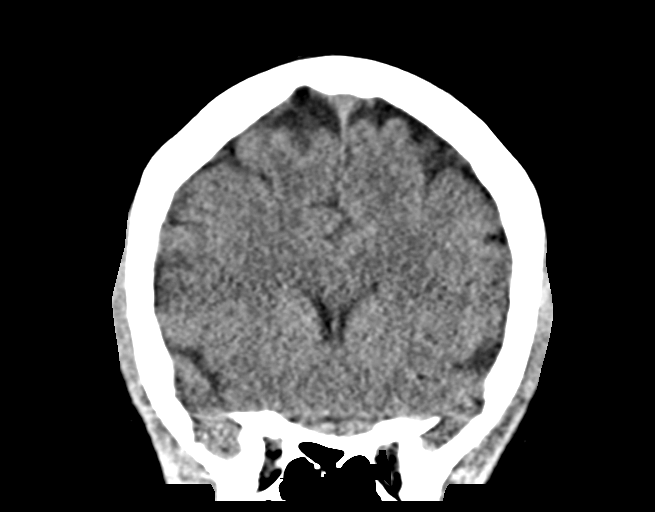
[im 30/67  brain]
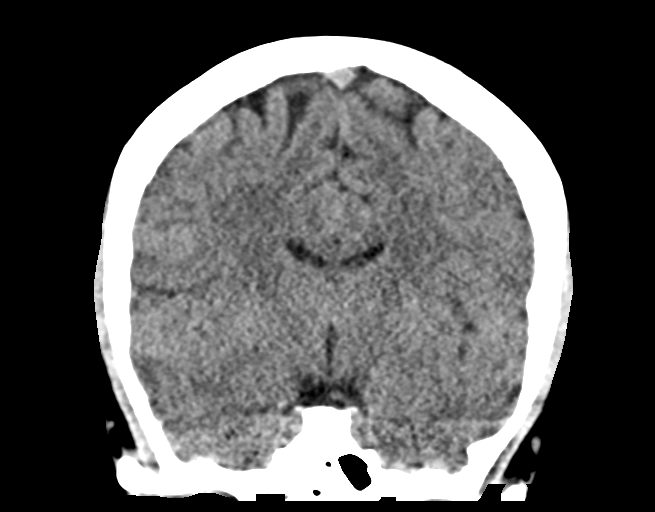
[im 37/67  brain]
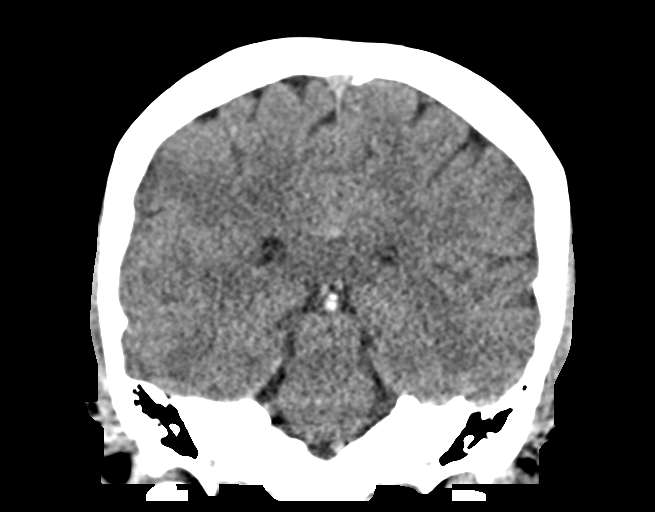

[Series 5: sag soft · sagittal · 0.29mm/px · 3 of 51 slices shown]
[im 17/51  brain]
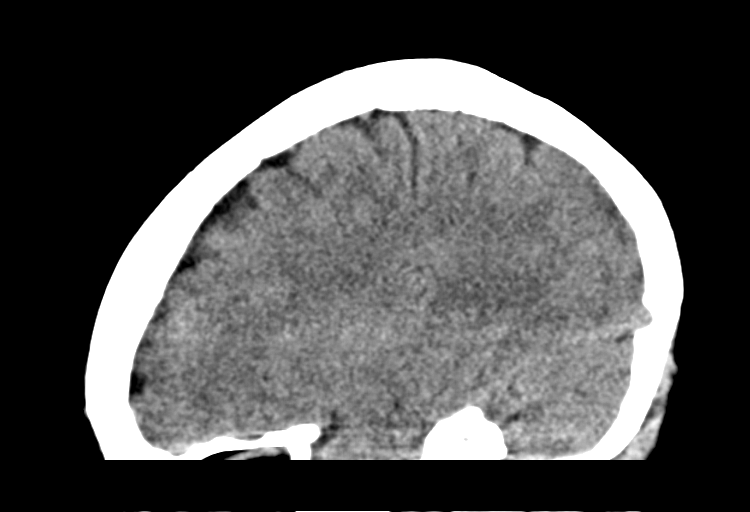
[im 26/51  brain]
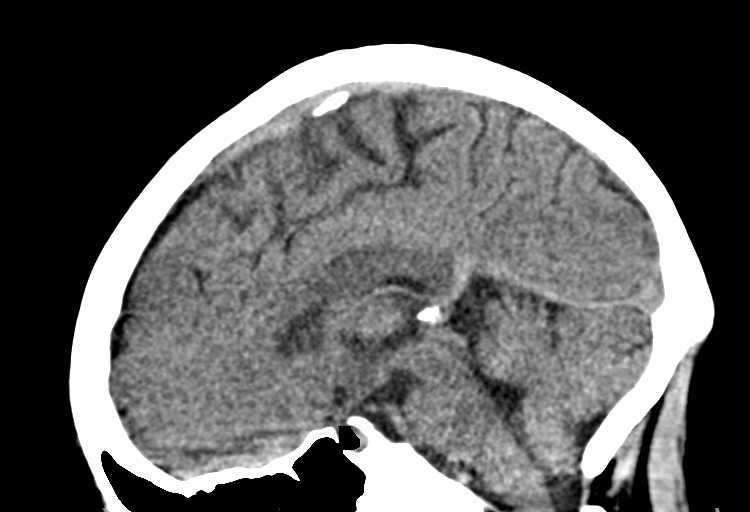
[im 34/51  brain]
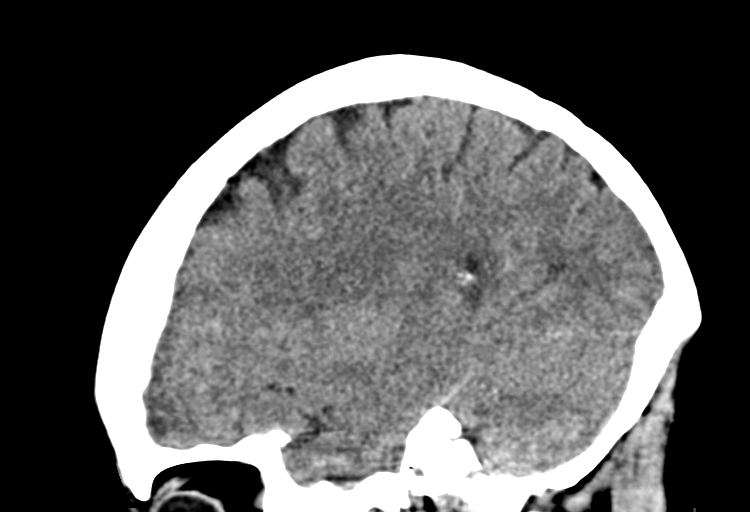

[15 of 46 positions shown; findings below may reference images not displayed]

FINDINGS: Brain: No evidence of acute infarction, hemorrhage, hydrocephalus,
extra-axial collection or mass lesion/mass effect.

Vascular: No hyperdense vessel or unexpected calcification.

Skull: Normal. Negative for fracture or focal lesion.

Sinuses/Orbits: No acute finding.

Other: None.
IMPRESSION: Negative exam.  No acute intracranial findings.

## 2019-05-22 ENCOUNTER — Ambulatory Visit: Payer: PRIVATE HEALTH INSURANCE | Attending: Internal Medicine

## 2019-05-22 DIAGNOSIS — Z23 Encounter for immunization: Secondary | ICD-10-CM

## 2019-05-22 NOTE — Progress Notes (Signed)
   Covid-19 Vaccination Clinic  Name:  Karen Holder    MRN: [id number] DOB: Jul 25, 1980  05/22/2019  Karen Holder was observed post Covid-19 immunization for 15 minutes without incident. She was provided with Vaccine Information Sheet and instruction to access the V-Safe system.   Karen Holder was instructed to call 911 with any severe reactions post vaccine: Marland Kitchen Difficulty breathing  . Swelling of face and throat  . A fast heartbeat  . A bad rash all over body  . Dizziness and weakness   Immunizations Administered    Name Date Dose VIS Date Route   Pfizer COVID-19 Vaccine 05/22/2019 10:00 AM 0.3 mL 02/07/2019 Intramuscular   Manufacturer: [employer], [employer]   Lot: [id number]   NDC: [address]-2

## 2019-06-16 ENCOUNTER — Ambulatory Visit: Payer: PRIVATE HEALTH INSURANCE | Attending: Internal Medicine

## 2019-06-16 DIAGNOSIS — Z23 Encounter for immunization: Secondary | ICD-10-CM

## 2019-06-16 NOTE — Progress Notes (Signed)
   Covid-19 Vaccination Clinic  Name:  Karen Holder    MRN: [id number] DOB: [birth year]  06/16/2019  Karen Holder was observed post Covid-19 immunization for 15 minutes without incident. She was provided with Vaccine Information Sheet and instruction to access the V-Safe system.   Karen Holder was instructed to call 911 with any severe reactions post vaccine: Marland Kitchen Difficulty breathing  . Swelling of face and throat  . A fast heartbeat  . A bad rash all over body  . Dizziness and weakness   Immunizations Administered    Name Date Dose VIS Date Route   Pfizer COVID-19 Vaccine 06/16/2019  9:18 AM 0.3 mL 04/23/2018 Intramuscular   Manufacturer: [employer], [employer]   Lot: [id number]   NDC: [address]-6
